# Patient Record
Sex: Male | Born: 1980 | Race: White | Hispanic: No | Marital: Married | Smoking: Former smoker
Health system: Southern US, Community
[De-identification: ages and names within clinical notes are randomized; demographics above are authoritative.]

## PROBLEM LIST (undated history)

## (undated) DIAGNOSIS — T7840XA Allergy, unspecified, initial encounter: Secondary | ICD-10-CM

## (undated) HISTORY — DX: Allergy, unspecified, initial encounter: T78.40XA

---

## 1998-08-26 ENCOUNTER — Encounter: Payer: Self-pay | Admitting: Emergency Medicine

## 1998-08-26 ENCOUNTER — Emergency Department (HOSPITAL_COMMUNITY): Admission: EM | Admit: 1998-08-26 | Discharge: 1998-08-26 | Payer: Self-pay | Admitting: Emergency Medicine

## 2013-02-13 ENCOUNTER — Ambulatory Visit: Payer: 59

## 2013-02-13 ENCOUNTER — Ambulatory Visit (INDEPENDENT_AMBULATORY_CARE_PROVIDER_SITE_OTHER): Payer: 59 | Admitting: Family Medicine

## 2013-02-13 VITALS — BP 110/68 | HR 60 | Temp 98.9°F | Resp 16 | Ht 69.0 in | Wt 160.0 lb

## 2013-02-13 DIAGNOSIS — M79609 Pain in unspecified limb: Secondary | ICD-10-CM

## 2013-02-13 DIAGNOSIS — M79645 Pain in left finger(s): Secondary | ICD-10-CM

## 2013-02-13 DIAGNOSIS — S62639A Displaced fracture of distal phalanx of unspecified finger, initial encounter for closed fracture: Secondary | ICD-10-CM

## 2013-02-13 NOTE — Progress Notes (Signed)
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   Able to move thumb well.  Right handed.  Objective:  Left thumb subungual hematoma released with some relief  [facility] reading (PRIMARY) by  Dr. Milus Glazier:  Left thumb tuft fracture  Assessment:  Thumb fx complicated by subungual hematoma, left  Plan:  Thumb splint ibuprofen Signed,  Elvina Sidle, MD.

## 2013-03-14 ENCOUNTER — Ambulatory Visit (INDEPENDENT_AMBULATORY_CARE_PROVIDER_SITE_OTHER): Payer: 59 | Admitting: Family Medicine

## 2013-03-14 VITALS — BP 122/78 | HR 70 | Temp 98.0°F | Resp 16 | Ht 67.0 in | Wt 156.0 lb

## 2013-03-14 DIAGNOSIS — M545 Low back pain, unspecified: Secondary | ICD-10-CM

## 2013-03-14 DIAGNOSIS — IMO0001 Reserved for inherently not codable concepts without codable children: Secondary | ICD-10-CM

## 2013-03-14 DIAGNOSIS — M5380 Other specified dorsopathies, site unspecified: Secondary | ICD-10-CM

## 2013-03-14 DIAGNOSIS — M7918 Myalgia, other site: Secondary | ICD-10-CM

## 2013-03-14 DIAGNOSIS — M62830 Muscle spasm of back: Secondary | ICD-10-CM

## 2013-03-14 LAB — COMPREHENSIVE METABOLIC PANEL
ALT: 12 U/L (ref 0–53)
AST: 15 U/L (ref 0–37)
Alkaline Phosphatase: 64 U/L (ref 39–117)
BUN: 14 mg/dL (ref 6–23)
Calcium: 9.7 mg/dL (ref 8.4–10.5)
Chloride: 104 mEq/L (ref 96–112)
Creat: 0.9 mg/dL (ref 0.50–1.35)
Total Bilirubin: 0.3 mg/dL (ref 0.3–1.2)

## 2013-03-14 LAB — POCT URINALYSIS DIPSTICK
Bilirubin, UA: NEGATIVE
Blood, UA: NEGATIVE
Glucose, UA: NEGATIVE
Leukocytes, UA: NEGATIVE
Nitrite, UA: NEGATIVE
Urobilinogen, UA: 0.2
pH, UA: 7

## 2013-03-14 LAB — POCT CBC
Granulocyte percent: 60.3 %G (ref 37–80)
HCT, POC: 45.5 % (ref 43.5–53.7)
Hemoglobin: 14.8 g/dL (ref 14.1–18.1)
MPV: 7.7 fL (ref 0–99.8)
POC Granulocyte: 4.2 (ref 2–6.9)
POC MID %: 6.7 %M (ref 0–12)
RDW, POC: 13.1 %

## 2013-03-14 LAB — C-REACTIVE PROTEIN: CRP: <0.5 mg/dL (ref <0.60)

## 2013-03-14 LAB — POCT SEDIMENTATION RATE: POCT SED RATE: 3 mm/hr (ref 0–22)

## 2013-03-14 MED ORDER — IBUPROFEN 800 MG PO TABS: 800.0000 mg | ORAL_TABLET | Freq: Three times a day (TID) | ORAL | Status: DC | PRN

## 2013-03-14 MED ORDER — CYCLOBENZAPRINE HCL 10 MG PO TABS: 10.0000 mg | ORAL_TABLET | Freq: Two times a day (BID) | ORAL | Status: DC | PRN
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
## 2013-03-14 NOTE — Patient Instructions (Addendum)
I recommend keeping ibuprofen on board - esp taking before and during work. Then when you come home, take a muscle relaxant followed by 15 minutes of heat followed by gentle stretching. Try to do this regiment 2 - 3 times a day.  If you are still having pain in 4 to 6 wks, come back to clinic for further eval. RTC immed if symptoms worsen or you develop any other concerning symptoms below.   Back Pain, Adult Low back pain is very common. About 1 in 5 people have back pain.The cause of low back pain is rarely dangerous. The pain often gets better over time.About half of people with a sudden onset of back pain feel better in just 2 weeks. About 8 in 10 people feel better by 6 weeks.  CAUSES Some common causes of back pain include:  Strain of the muscles or ligaments supporting the spine.  Wear and tear (degeneration) of the spinal discs.  Arthritis.  Direct injury to the back. DIAGNOSIS Most of the time, the direct cause of low back pain is not known.However, back pain can be treated effectively even when the exact cause of the pain is unknown.Answering your caregiver's questions about your overall health and symptoms is one of the most accurate ways to make sure the cause of your pain is not dangerous. If your caregiver needs more information, he or she may order lab work or imaging tests (X-rays or MRIs).However, even if imaging tests show changes in your back, this usually does not require surgery. HOME CARE INSTRUCTIONS For many people, back pain returns.Since low back pain is rarely dangerous, it is often a condition that people can learn to [facility] their own.   Remain active. It is stressful on the back to sit or stand in one place. Do not sit, drive, or stand in one place for more than 30 minutes at a time. Take short walks on level surfaces as soon as pain allows.Try to increase the length of time you walk each day.  Do not stay in bed.Resting more than 1 or 2 days can delay  your recovery.  Do not avoid exercise or work.Your body is made to move.It is not dangerous to be active, even though your back may hurt.Your back will likely heal faster if you return to being active before your pain is gone.  Pay attention to your body when you bend and lift. Many people have less discomfortwhen lifting if they bend their knees, keep the load close to their bodies,and avoid twisting. Often, the most comfortable positions are those that put less stress on your recovering back.  Find a comfortable position to sleep. Use a firm mattress and lie on your side with your knees slightly bent. If you lie on your back, put a pillow under your knees.  Only take over-the-counter or prescription medicines as directed by your caregiver. Over-the-counter medicines to reduce pain and inflammation are often the most helpful.Your caregiver may prescribe muscle relaxant drugs.These medicines help dull your pain so you can more quickly return to your normal activities and healthy exercise.  Put ice on the injured area.  Put ice in a plastic bag.  Place a towel between your skin and the bag.  Leave the ice on for 15-20 minutes, 3-4 times a day for the first 2 to 3 days. After that, ice and heat may be alternated to reduce pain and spasms.  Ask your caregiver about trying back exercises and gentle massage. This may be  of some benefit.  Avoid feeling anxious or stressed.Stress increases muscle tension and can worsen back pain.It is important to recognize when you are anxious or stressed and learn ways to manage it.Exercise is a great option. SEEK MEDICAL CARE IF:  You have pain that is not relieved with rest or medicine.  You have pain that does not improve in 1 week.  You have new symptoms.  You are generally not feeling well. SEEK IMMEDIATE MEDICAL CARE IF:   You have pain that radiates from your back into your legs.  You develop new bowel or bladder control  problems.  You have unusual weakness or numbness in your arms or legs.  You develop nausea or vomiting.  You develop abdominal pain.  You feel faint. Document Released: 06/06/2005 Document Revised: 12/06/2011 Document Reviewed: 10/25/2010 [facility] Patient Information 2014 [employer], [place].

## 2013-05-09 ENCOUNTER — Encounter: Payer: Self-pay | Admitting: Family Medicine

## 2013-06-14 DIAGNOSIS — M549 Dorsalgia, unspecified: Secondary | ICD-10-CM | POA: Insufficient documentation

## 2013-07-30 ENCOUNTER — Other Ambulatory Visit: Payer: Self-pay | Admitting: Orthopaedic Surgery

## 2013-07-30 DIAGNOSIS — M542 Cervicalgia: Secondary | ICD-10-CM

## 2013-07-30 DIAGNOSIS — M546 Pain in thoracic spine: Secondary | ICD-10-CM

## 2013-08-01 ENCOUNTER — Other Ambulatory Visit: Payer: Self-pay | Admitting: Orthopaedic Surgery

## 2013-08-01 DIAGNOSIS — M546 Pain in thoracic spine: Secondary | ICD-10-CM

## 2013-08-01 DIAGNOSIS — M542 Cervicalgia: Secondary | ICD-10-CM

## 2013-08-12 ENCOUNTER — Ambulatory Visit
Admission: RE | Admit: 2013-08-12 | Discharge: 2013-08-12 | Disposition: A | Discharge status: Home / Self Care | Payer: 59 | Source: Ambulatory Visit | Attending: Orthopaedic Surgery | Admitting: Orthopaedic Surgery

## 2013-08-12 DIAGNOSIS — M542 Cervicalgia: Secondary | ICD-10-CM

## 2013-08-12 DIAGNOSIS — M546 Pain in thoracic spine: Secondary | ICD-10-CM

## 2014-02-14 ENCOUNTER — Ambulatory Visit (INDEPENDENT_AMBULATORY_CARE_PROVIDER_SITE_OTHER): Payer: 59 | Admitting: Family Medicine

## 2014-02-14 ENCOUNTER — Ambulatory Visit (INDEPENDENT_AMBULATORY_CARE_PROVIDER_SITE_OTHER): Payer: 59

## 2014-02-14 ENCOUNTER — Encounter: Payer: Self-pay | Admitting: Family Medicine

## 2014-02-14 VITALS — BP 96/62 | HR 71 | Temp 97.9°F | Resp 16 | Ht 67.5 in | Wt 155.0 lb

## 2014-02-14 DIAGNOSIS — M5380 Other specified dorsopathies, site unspecified: Secondary | ICD-10-CM

## 2014-02-14 DIAGNOSIS — R05 Cough: Secondary | ICD-10-CM

## 2014-02-14 DIAGNOSIS — R059 Cough, unspecified: Secondary | ICD-10-CM

## 2014-02-14 DIAGNOSIS — R002 Palpitations: Secondary | ICD-10-CM

## 2014-02-14 DIAGNOSIS — M62830 Muscle spasm of back: Secondary | ICD-10-CM

## 2014-02-14 DIAGNOSIS — F17200 Nicotine dependence, unspecified, uncomplicated: Secondary | ICD-10-CM

## 2014-02-14 DIAGNOSIS — R5381 Other malaise: Secondary | ICD-10-CM

## 2014-02-14 DIAGNOSIS — R5383 Other fatigue: Secondary | ICD-10-CM

## 2014-02-14 LAB — POCT CBC
Granulocyte percent: 59 %G (ref 37–80)
HCT, POC: 46.1 % (ref 43.5–53.7)
Hemoglobin: 15.1 g/dL (ref 14.1–18.1)
Lymph, poc: 2.4 (ref 0.6–3.4)
MCH, POC: 31.2 pg (ref 27–31.2)
MCHC: 32.8 g/dL (ref 31.8–35.4)
MCV: 95 fL (ref 80–97)
MID (CBC): 0.3 (ref 0–0.9)
MPV: 7.2 fL (ref 0–99.8)
PLATELET COUNT, POC: 229 10*3/uL (ref 142–424)
POC GRANULOCYTE: 3.8 (ref 2–6.9)
POC LYMPH %: 36.4 % (ref 10–50)
POC MID %: 4.6 % (ref 0–12)
RBC: 4.85 M/uL (ref 4.69–6.13)
RDW, POC: 13.5 %
WBC: 6.5 10*3/uL (ref 4.6–10.2)

## 2014-02-14 LAB — VITAMIN B12: VITAMIN B 12: 450 pg/mL (ref 211–911)

## 2014-02-14 LAB — COMPREHENSIVE METABOLIC PANEL
ALK PHOS: 60 U/L (ref 39–117)
ALT: 11 U/L (ref 0–53)
AST: 15 U/L (ref 0–37)
Albumin: 4.9 g/dL (ref 3.5–5.2)
BILIRUBIN TOTAL: 0.7 mg/dL (ref 0.2–1.2)
BUN: 13 mg/dL (ref 6–23)
CO2: 26 mEq/L (ref 19–32)
Calcium: 9.7 mg/dL (ref 8.4–10.5)
Chloride: 105 mEq/L (ref 96–112)
Creat: 0.91 mg/dL (ref 0.50–1.35)
GLUCOSE: 84 mg/dL (ref 70–99)
Potassium: 4.1 mEq/L (ref 3.5–5.3)
Sodium: 141 mEq/L (ref 135–145)
TOTAL PROTEIN: 6.9 g/dL (ref 6.0–8.3)

## 2014-02-14 LAB — TSH: TSH: 0.816 u[IU]/mL (ref 0.350–4.500)

## 2014-02-14 LAB — POCT SEDIMENTATION RATE: POCT SED RATE: 2 mm/hr (ref 0–22)

## 2014-02-14 MED ORDER — IPRATROPIUM-ALBUTEROL 0.5-2.5 (3) MG/3ML IN SOLN
3.0000 mL | Freq: Once | RESPIRATORY_TRACT | Status: AC
  Administered 2014-02-14: 3 mL via RESPIRATORY_TRACT

## 2014-02-14 NOTE — Progress Notes (Addendum)
Subjective:  This chart was scribed for Ryan Sorenson, MD by Carl Best, Medical Scribe. This patient was seen in Room 25 and the patient's care was started at 4:44 PM.   Patient ID: Ryan Schaefer, male    DOB: August 15, 1980, 33 y.o.   MRN: [id number]  Chief Complaint  Patient presents with  . Cough    coughing up black phlem for two or three years  . Palpitations    feels like his heartrate increases at times  . Fatigue  . sweaty palms  . Back Pain    middle left side of back pain   HPI HPI Comments: Ryan Schaefer is a 33 y.o. male who presents to the [facility] complaining of constant left sided back pain.  He was seen by [facility] by Dr. Rosanne Gutting.; see problem list.  EKG was normal with no sinus changes.  X-ray revealed flattened diaphragms but otherwise no significant abnormalities.  He went to integrative therapy for physical therapy with no significant changes in his symptoms.  He has been doing back exercises to try and has seen mild improvement in his pain.  He has not had an MRI of his spine.  He did not experience any relief to his symptoms when he was using Flexeril.  He has had an injection in his back but not in the spot the pain is currently located.  He has never taken Prednisone for his symptoms.  He works on [employer] for a living.   He is also complaining of a constant cough productive of black sputum.  His wife states that he coughs everyday and has been more fatigued than usual.  He denies SOB as an associated symptom.  He states that he wheezes but contributes this to smoking 0.5 pack a day.  He is trying to quit smoking cold [place].  He has tried Chantix in the past but it did not relieve his nicotine cravings.  He has never used an inhaler in the past.  He does not take any vitamins, supplements, or OTC medications.     Patient Active Problem List   Diagnosis Date Noted  . Back pain 06/14/2013   No past medical history on  file. No past surgical history on file. Family History  Problem Relation Age of Onset  . Cancer Mother   . Diabetes Father   . Heart disease Maternal Grandmother   . Cancer Maternal Grandfather   . Diabetes Paternal Grandmother    History   Social History  . Marital Status: Married    Spouse Name: N/A    Number of Children: N/A  . Years of Education: N/A   Occupational History  . Not on file.   Social History Main Topics  . Smoking status: Current Every Day Smoker -- 1.50 packs/day    Types: Cigarettes  . Smokeless tobacco: Not on file  . Alcohol Use: Yes  . Drug Use: No  . Sexual Activity: Not on file   Other Topics Concern  . Not on file   Social History Narrative  . No narrative on file   No Known Allergies  Review of Systems  Constitutional: Positive for fatigue.  Respiratory: Positive for cough and wheezing. Negative for shortness of breath.      Objective:  BP 96/62  Pulse 71  Temp(Src) 97.9 F (36.6 C) (Oral)  Resp 16  Ht 5' 7.5" (1.715 m)  Wt 155 lb (70.308 kg)  BMI 23.90 kg/m2  SpO2 97% Physical Exam  Nursing note and vitals reviewed. Constitutional: He is oriented to person, place, and time. He appears well-developed and well-nourished.  HENT:  Head: Normocephalic and atraumatic.  Right Ear: Hearing, tympanic membrane, external ear and ear canal normal.  Left Ear: Hearing, external ear and ear canal normal. Tympanic membrane is injected. A middle ear effusion is present.  Mouth/Throat: Uvula is midline, oropharynx is clear and moist and mucous membranes are normal. No oropharyngeal exudate, posterior oropharyngeal edema or posterior oropharyngeal erythema.  Terminates mildly swollen.  Eyes: EOM are normal.  Neck: Normal range of motion.  Cardiovascular: Normal rate, regular rhythm and normal heart sounds.  Exam reveals no gallop and no friction rub.   No murmur heard. Pulmonary/Chest: Effort normal. No respiratory distress. He has wheezes.  He has no rales.  Musculoskeletal: Normal range of motion.  Lymphadenopathy:    He has no cervical adenopathy.  Neurological: He is alert and oriented to person, place, and time.  Skin: Skin is warm and dry.  Psychiatric: He has a normal mood and affect. His behavior is normal.   S/p duoneb, pulmonary exam unchanged  Results for orders placed in visit on 02/14/14  POCT CBC      Result Value Ref Range   WBC 6.5  4.6 - 10.2 K/uL   Lymph, poc 2.4  0.6 - 3.4   POC LYMPH PERCENT 36.4  10 - 50 %L   MID (cbc) 0.3  0 - 0.9   POC MID % 4.6  0 - 12 %M   POC Granulocyte 3.8  2 - 6.9   Granulocyte percent 59.0  37 - 80 %G   RBC 4.85  4.69 - 6.13 M/uL   Hemoglobin 15.1  14.1 - 18.1 g/dL   HCT, POC 09.8  11.9 - 53.7 %   MCV 95.0  80 - 97 fL   MCH, POC 31.2  27 - 31.2 pg   MCHC 32.8  31.8 - 35.4 g/dL   RDW, POC 14.7     Platelet Count, POC 229  142 - 424 K/uL   MPV 7.2  0 - 99.8 fL   Peak flow - 640; predicted peak flow 630  [facility] reading (PRIMARY) by  Dr. Clelia Croft. CXR: No acute abnormality  EXAM: CHEST 2 VIEW  COMPARISON: 11/20/2007  FINDINGS: The heart size and mediastinal contours are within normal limits. Both lungs are clear. The visualized skeletal structures are unremarkable.  IMPRESSION: No active cardiopulmonary disease.  EKG: NSR no ischemic changes  Assessment & Plan:  .  Discussed administering a breathing treatment in the office.  Goal peak flow is 630. Cough - Plan: POCT CBC, POCT SEDIMENTATION RATE, DG Chest 2 View, EKG 12-Lead, ipratropium-albuterol (DUONEB) 0.5-2.5 (3) MG/3ML nebulizer solution 3 mL  Palpitations - Plan: POCT CBC, EKG 12-Lead - reassured, if sxs cont, refer to cardiology for event monitor.  Other malaise and fatigue - Plan: Comprehensive metabolic panel, TSH, Vitamin B12, Vit D  25 hydroxy (rtn osteoporosis monitoring)  Tobacco use disorder - pt planning to quit in 3d w/ a friend - cold [place]  Lumbar paraspinal muscle spasm - Discussed a  clinical suspicion of a muscle spasm in the patient's back due to a previous back injury.  Discussed starting the patient on Prednisone, referring him to different physical therapists, and administering a trigger point Prednisone injection of the paraspinal muscle. Failed flexeril, has seen Dr. Yevette Edwards and been [facility] - feels like muscle spasm  has moved a little but this one has been present about a yr.  Declines intervention at this time but if he changes his mind, ok to call in prednisone burst - 80 mg qd x 3d, or refer to other PT - consider referral to Dr. Raeanne Barry. . . .  Meds ordered this encounter  Medications  . ipratropium-albuterol (DUONEB) 0.5-2.5 (3) MG/3ML nebulizer solution 3 mL    Sig:     I personally performed the services described in this documentation, which was scribed in my presence. The recorded information has been reviewed and considered, and addended by me as needed.  Ryan Sorenson, MD MPH

## 2014-02-15 LAB — VITAMIN D 25 HYDROXY (VIT D DEFICIENCY, FRACTURES): Vit D, 25-Hydroxy: 40 ng/mL (ref 30–89)

## 2014-02-16 ENCOUNTER — Encounter: Payer: Self-pay | Admitting: Family Medicine

## 2014-09-15 ENCOUNTER — Other Ambulatory Visit (HOSPITAL_COMMUNITY): Payer: Self-pay

## 2016-04-08 IMAGING — CR DG CHEST 2V
2 series · 2 of 2 positions shown · non-contrast
Comparison: 11/20/2007

CLINICAL DATA: Cough

EXAM:
CHEST  2 VIEW

[lateral]
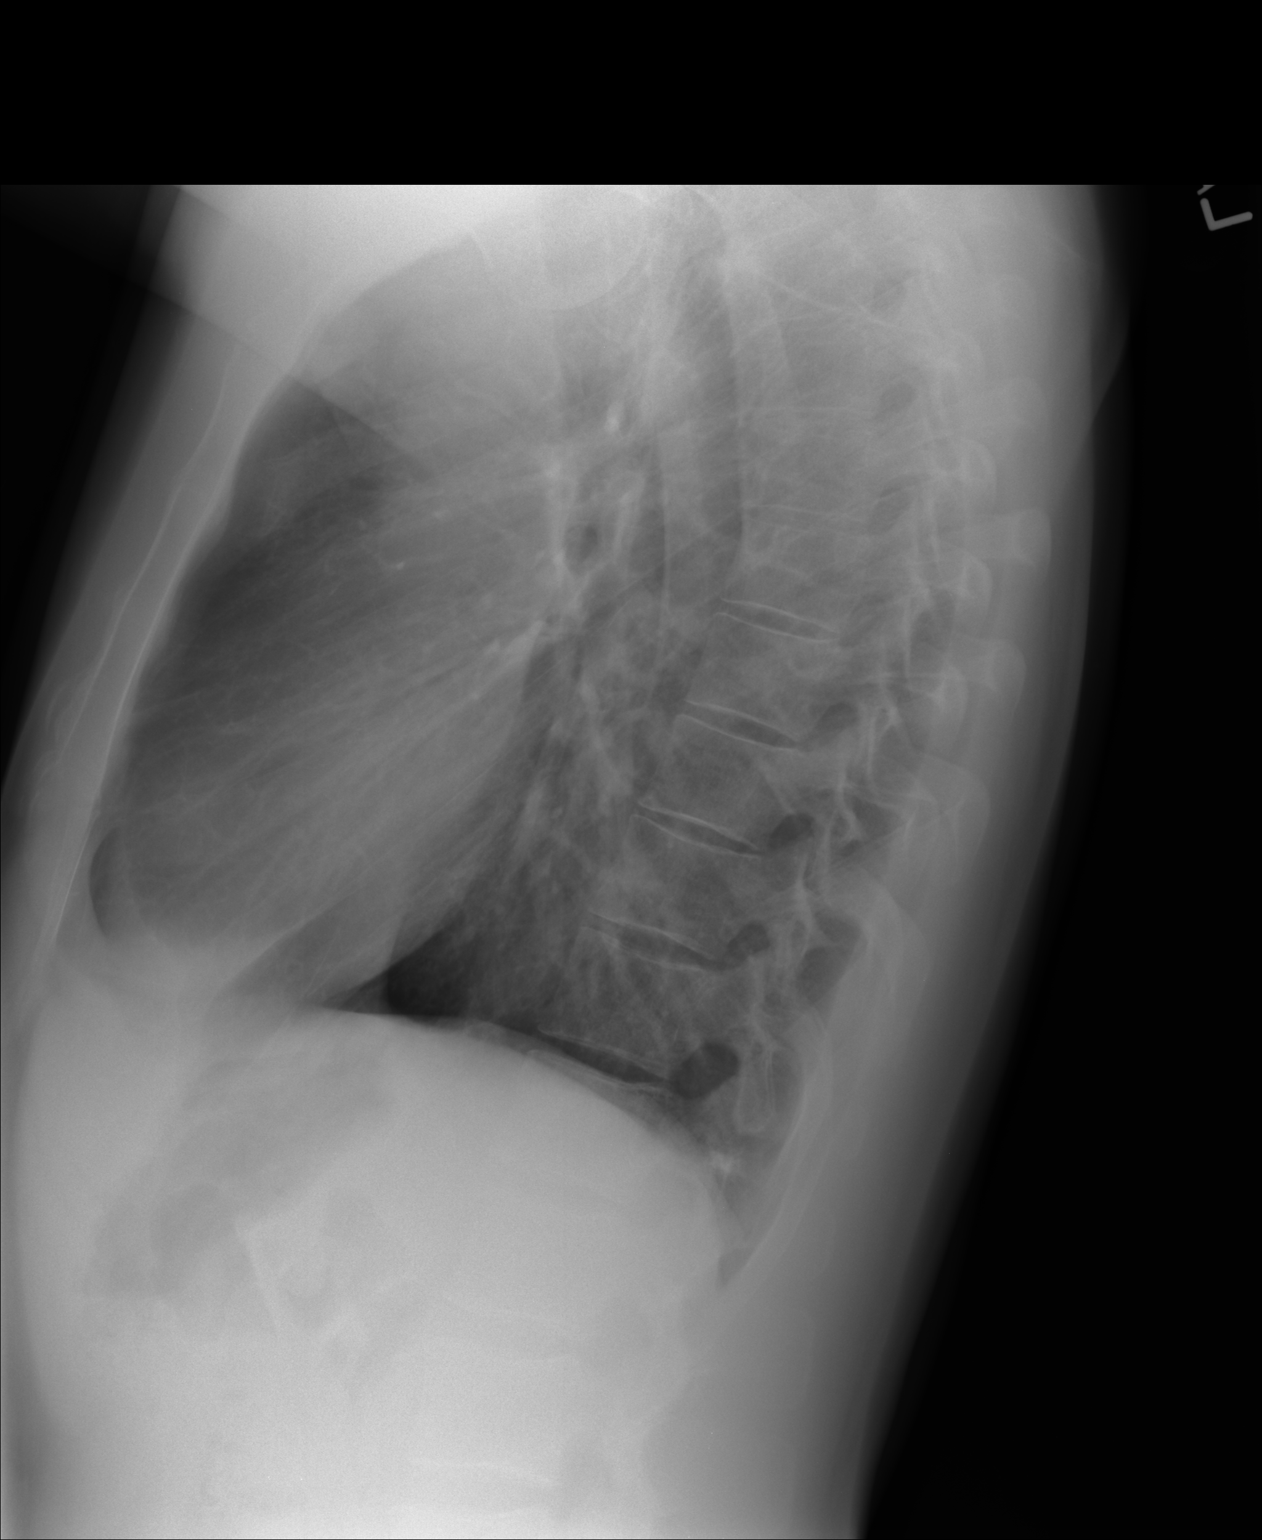

[PA]
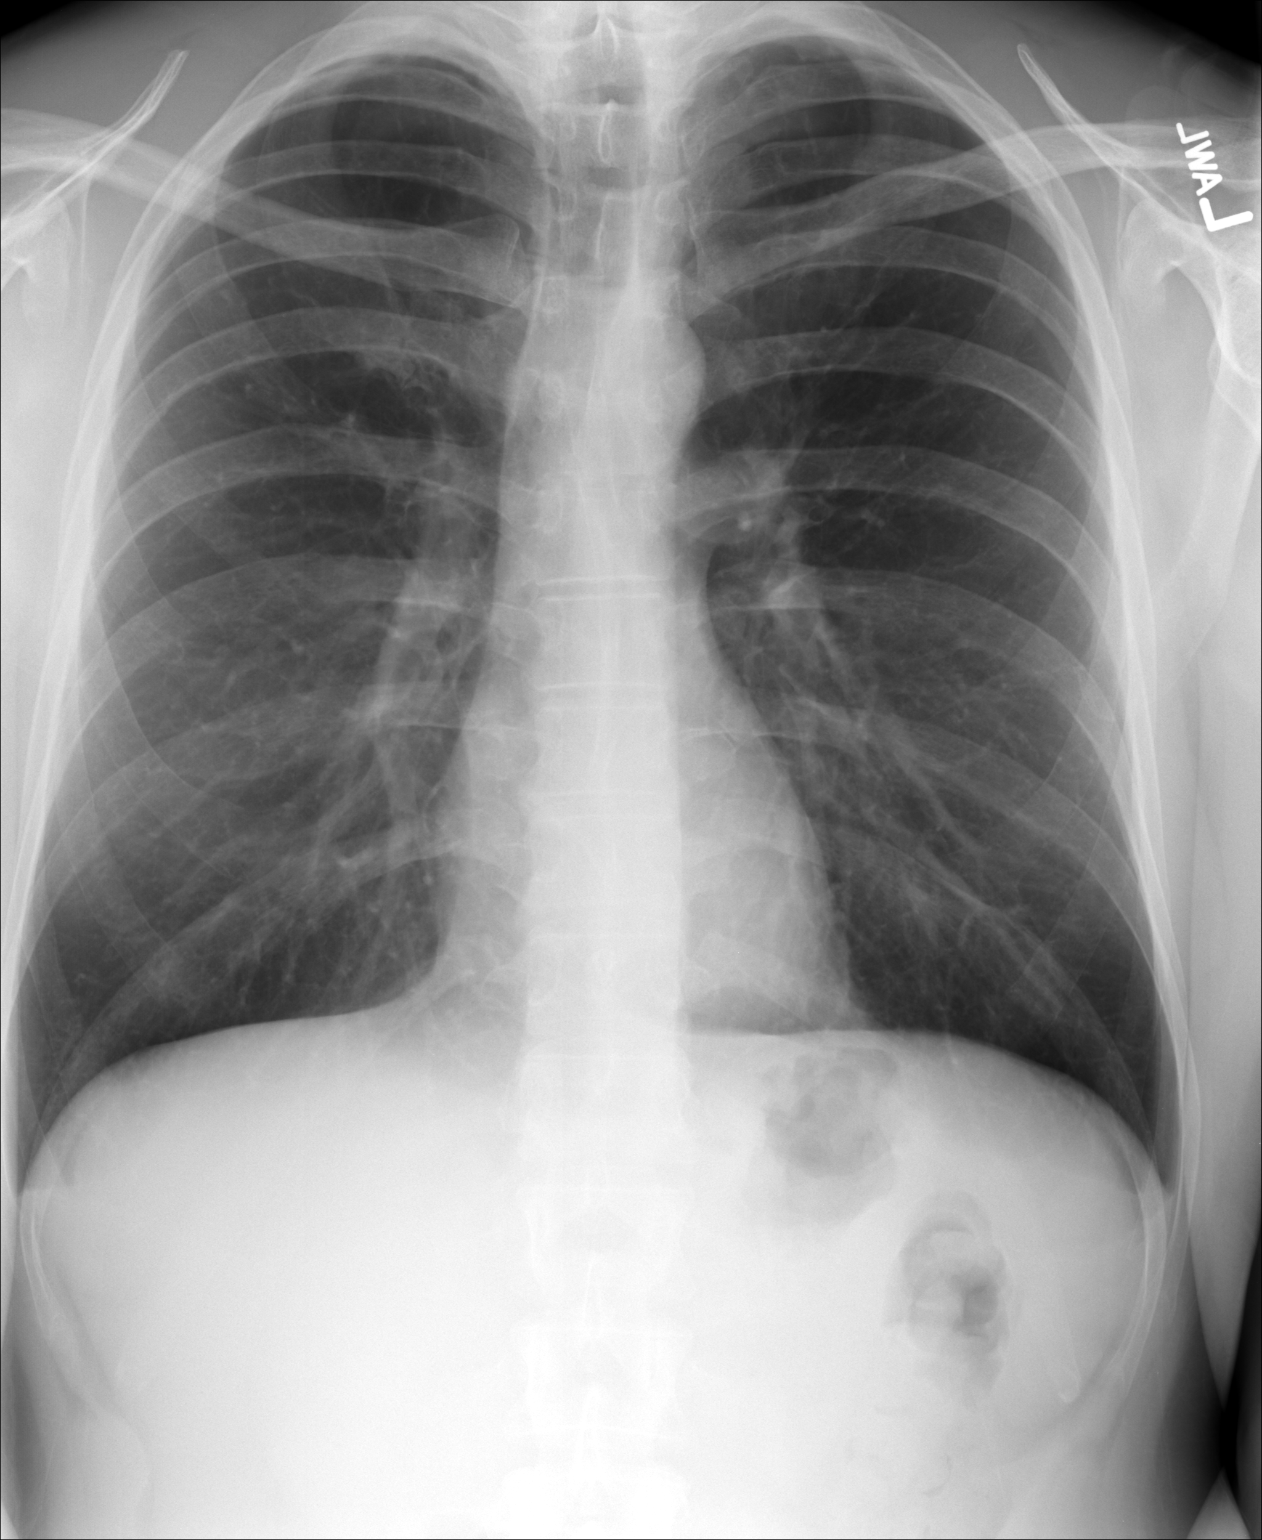

[2 of 2 positions shown; findings below may reference images not displayed]

FINDINGS: The heart size and mediastinal contours are within normal limits.
Both lungs are clear. The visualized skeletal structures are
unremarkable.
IMPRESSION: No active cardiopulmonary disease.

## 2016-12-10 ENCOUNTER — Ambulatory Visit (INDEPENDENT_AMBULATORY_CARE_PROVIDER_SITE_OTHER): Payer: 59

## 2016-12-10 ENCOUNTER — Ambulatory Visit (INDEPENDENT_AMBULATORY_CARE_PROVIDER_SITE_OTHER): Payer: 59 | Admitting: Family Medicine

## 2016-12-10 ENCOUNTER — Encounter: Payer: Self-pay | Admitting: Family Medicine

## 2016-12-10 VITALS — BP 104/68 | HR 54 | Temp 98.8°F | Resp 16 | Ht 68.5 in | Wt 148.0 lb

## 2016-12-10 DIAGNOSIS — R634 Abnormal weight loss: Secondary | ICD-10-CM

## 2016-12-10 DIAGNOSIS — R3129 Other microscopic hematuria: Secondary | ICD-10-CM | POA: Diagnosis not present

## 2016-12-10 LAB — POCT CBC
GRANULOCYTE PERCENT: 77.4 % (ref 37–80)
HCT, POC: 44.3 % (ref 43.5–53.7)
HEMOGLOBIN: 15.4 g/dL (ref 14.1–18.1)
Lymph, poc: 1.8 (ref 0.6–3.4)
MCH: 32 pg — AB (ref 27–31.2)
MCHC: 34.7 g/dL (ref 31.8–35.4)
MCV: 92.2 fL (ref 80–97)
MID (cbc): 0.2 (ref 0–0.9)
MPV: 6.5 fL (ref 0–99.8)
PLATELET COUNT, POC: 230 10*3/uL (ref 142–424)
POC Granulocyte: 7 — AB (ref 2–6.9)
POC LYMPH PERCENT: 20.1 %L (ref 10–50)
POC MID %: 2.5 % (ref 0–12)
RBC: 4.8 M/uL (ref 4.69–6.13)
RDW, POC: 13.2 %
WBC: 9.1 10*3/uL (ref 4.6–10.2)

## 2016-12-10 LAB — POCT URINALYSIS DIP (MANUAL ENTRY)
BILIRUBIN UA: NEGATIVE
GLUCOSE UA: NEGATIVE mg/dL
Ketones, POC UA: NEGATIVE mg/dL
Leukocytes, UA: NEGATIVE
Nitrite, UA: NEGATIVE
Protein Ur, POC: NEGATIVE mg/dL
SPEC GRAV UA: 1.02 (ref 1.010–1.025)
Urobilinogen, UA: 0.2 E.U./dL
pH, UA: 5.5 (ref 5.0–8.0)

## 2016-12-10 LAB — POCT GLYCOSYLATED HEMOGLOBIN (HGB A1C): Hemoglobin A1C: 5.1

## 2016-12-10 LAB — POC MICROSCOPIC URINALYSIS (UMFC)

## 2016-12-10 MED ORDER — OMEPRAZOLE 40 MG PO CPDR: 40.0000 mg | DELAYED_RELEASE_CAPSULE | Freq: Every day | ORAL | 1 refills | Status: DC

## 2016-12-10 NOTE — Progress Notes (Signed)
Patient ID: Fernanda DrumDavid L Styles, male   DOB: 12/26/1980, 36 y.o.   MRN: [id number] Spirometry, EKG, PSA, chest CT if neg Send
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
## 2016-12-10 NOTE — Patient Instructions (Addendum)
Thank you for coming in today, it was so nice to see you! Today we talked about:    All of your blood work so far looks normal. Your chest xray looks normal as well. We will have you come back in 1 month to check your weight and follow up with how you are doing.   Please follow up in 1 month. You can schedule this appointment at the front desk before you leave or call the clinic.  Sincerely,  Anders Simmonds, MD  Dg Chest 2 View  Result Date: 12/10/2016 CLINICAL DATA:  Weight loss. EXAM: CHEST  2 VIEW COMPARISON:  February 14, 2014 FINDINGS: The heart size and mediastinal contours are within normal limits. Both lungs are clear. The visualized skeletal structures are unremarkable. IMPRESSION: No active cardiopulmonary disease. Electronically Signed   By: Gerome Sam III M.D   On: 12/10/2016 12:10     IF you received an x-ray today, you will receive an invoice from [facility]. Please contact [facility] at [phone] with questions or concerns regarding your invoice.   IF you received labwork today, you will receive an invoice from [facility]. Please contact [facility] at [phone] with questions or concerns regarding your invoice.   Our billing staff will not be able to assist you with questions regarding bills from these companies.  You will be contacted with the lab results as soon as they are available. The fastest way to get your results is to activate your My Chart account. Instructions are located on the last page of this paperwork. If you have not heard from [place] regarding the results in 2 weeks, please contact this office.     Failure to Thrive, Adult Failure to thrive is a group of symptoms that affect elderly adults. These symptoms include loss of appetite and weight loss. People who have this condition may do fewer and fewer activities over time. They may lose interest in being with friends or they may not want to eat or drink. This condition is not a  normal part of aging. What are the causes? This condition may be caused by:  A disease, such dementia, diabetes, cancer, or lung disease.  A health problem, such as a vitamin deficiency or a heart problem.  A disorder, such as depression.  A disability.  Medicines.  Mistreatment or neglect.  In some cases, the cause may not be known. What are the signs or symptoms? Symptoms of this condition include:  Loss of more than 5% of your body weight.  Being more tired than normal after an activity.  Having trouble getting up after sitting.  Loss of appetite.  Not getting out of bed.  Not wanting to do usual activities.  Depression.  Getting infections often.  Bedsores.  Taking a long time to recover after an injury or a surgery.  Weakness.  How is this diagnosed? This condition may be diagnosed with a physical exam. Your health care provider will ask questions about your health, behavior, and mood, such as:  Has your activity changed?  Do you seem sad?  Are your eating habits different?  Tests may also be done. They may include:  Blood tests.  Urine tests.  Imaging tests, such as X-rays, a CT scan, or MRI.  Hearing tests.  Vision tests.  Tests to check thinking ability (cognitive tests).  Activity tests to see if you can do tasks such as bathing and dressing and to see if you can move around safely.  You may  be referred to a specialist. How is this treated? Treatment for this condition depends on the cause. It may involve:  Treating the cause.  Talk therapy or medicine to treat depression.  Improving diet, such as by eating more often or taking nutritional supplements.  Changing or stopping a medicine.  Physical therapy.  It often takes a team of health care providers to find the right treatment. Follow these instructions at home:  Take over-the-counter and prescription medicines only as told by your health care provider.  Eat a healthy,  well-balanced diet. Make sure to get enough calories in each meal.  Be physically active. Include strength training as part of your exercise routine. A physical therapist can help to set up an exercise program that fits you.  Make sure that you are safe at home.  Make sure that you have a plan for what to do if you become unable to make decisions for yourself. Contact a health care provider if:  You are not able to eat well.  You are not able to move around.  You feel very sad or hopeless. Get help right away if:  You have thoughts of ending your life.  You cannot eat or drink.  You do not get out of bed.  Staying at home is no longer safe.  You have a fever. This information is not intended to replace advice given to you by your health care provider. Make sure you discuss any questions you have with your health care provider. Document Released: 08/29/2011 Document Revised: 11/12/2015 Document Reviewed: 09/01/2014 Elsevier Interactive Patient Education  [employer]2018 [employer]

## 2016-12-11 LAB — COMPREHENSIVE METABOLIC PANEL
ALBUMIN: 5.1 g/dL (ref 3.5–5.5)
ALT: 21 IU/L (ref 0–44)
AST: 27 IU/L (ref 0–40)
Albumin/Globulin Ratio: 2.3 — ABNORMAL HIGH (ref 1.2–2.2)
Alkaline Phosphatase: 70 IU/L (ref 39–117)
BUN / CREAT RATIO: 13 (ref 9–20)
BUN: 13 mg/dL (ref 6–20)
Bilirubin Total: 0.7 mg/dL (ref 0.0–1.2)
CALCIUM: 9.7 mg/dL (ref 8.7–10.2)
CO2: 24 mmol/L (ref 20–29)
CREATININE: 0.97 mg/dL (ref 0.76–1.27)
Chloride: 101 mmol/L (ref 96–106)
GFR, EST AFRICAN AMERICAN: 114 mL/min/{1.73_m2} (ref >59)
GFR, EST NON AFRICAN AMERICAN: 99 mL/min/{1.73_m2} (ref >59)
GLOBULIN, TOTAL: 2.2 g/dL (ref 1.5–4.5)
Glucose: 88 mg/dL (ref 65–99)
Potassium: 4.3 mmol/L (ref 3.5–5.2)
SODIUM: 140 mmol/L (ref 134–144)
TOTAL PROTEIN: 7.3 g/dL (ref 6.0–8.5)

## 2016-12-11 LAB — THYROID PANEL WITH TSH
FREE THYROXINE INDEX: 1.5 (ref 1.2–4.9)
T3 UPTAKE RATIO: 24 % (ref 24–39)
T4 TOTAL: 6.2 ug/dL (ref 4.5–12.0)
TSH: 1.61 u[IU]/mL (ref 0.450–4.500)

## 2016-12-11 LAB — LIPID PANEL
CHOL/HDL RATIO: 3.3 ratio (ref 0.0–5.0)
Cholesterol, Total: 164 mg/dL (ref 100–199)
HDL: 49 mg/dL (ref >39)
LDL Calculated: 92 mg/dL (ref 0–99)
TRIGLYCERIDES: 114 mg/dL (ref 0–149)
VLDL Cholesterol Cal: 23 mg/dL (ref 5–40)

## 2016-12-11 LAB — SEDIMENTATION RATE: Sed Rate: 2 mm/hr (ref 0–15)

## 2016-12-11 LAB — HIV ANTIBODY (ROUTINE TESTING W REFLEX): HIV Screen 4th Generation wRfx: NONREACTIVE

## 2016-12-11 LAB — C-REACTIVE PROTEIN: CRP: 1.3 mg/L (ref 0.0–4.9)

## 2019-02-02 IMAGING — DX DG CHEST 2V
2 series · 2 of 2 positions shown · non-contrast
Comparison: February 14, 2014

CLINICAL DATA: Weight loss.

EXAM:
CHEST  2 VIEW

[chest pa]
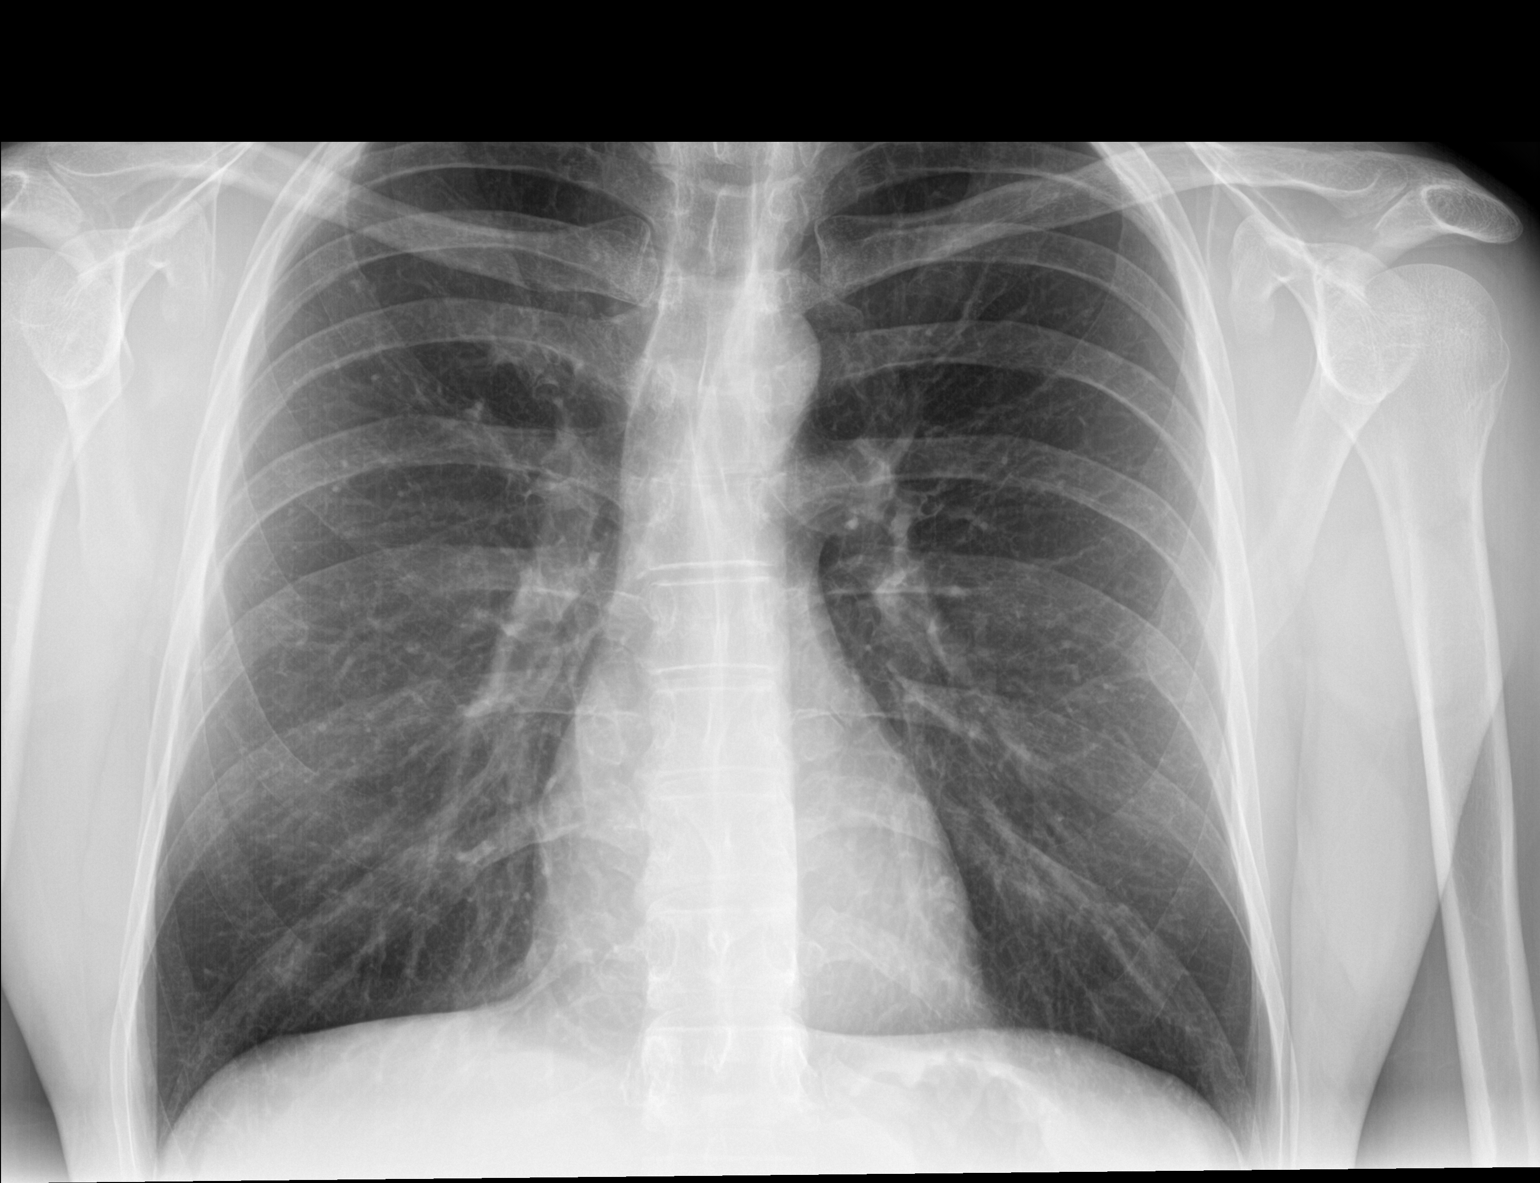

[chest lat]
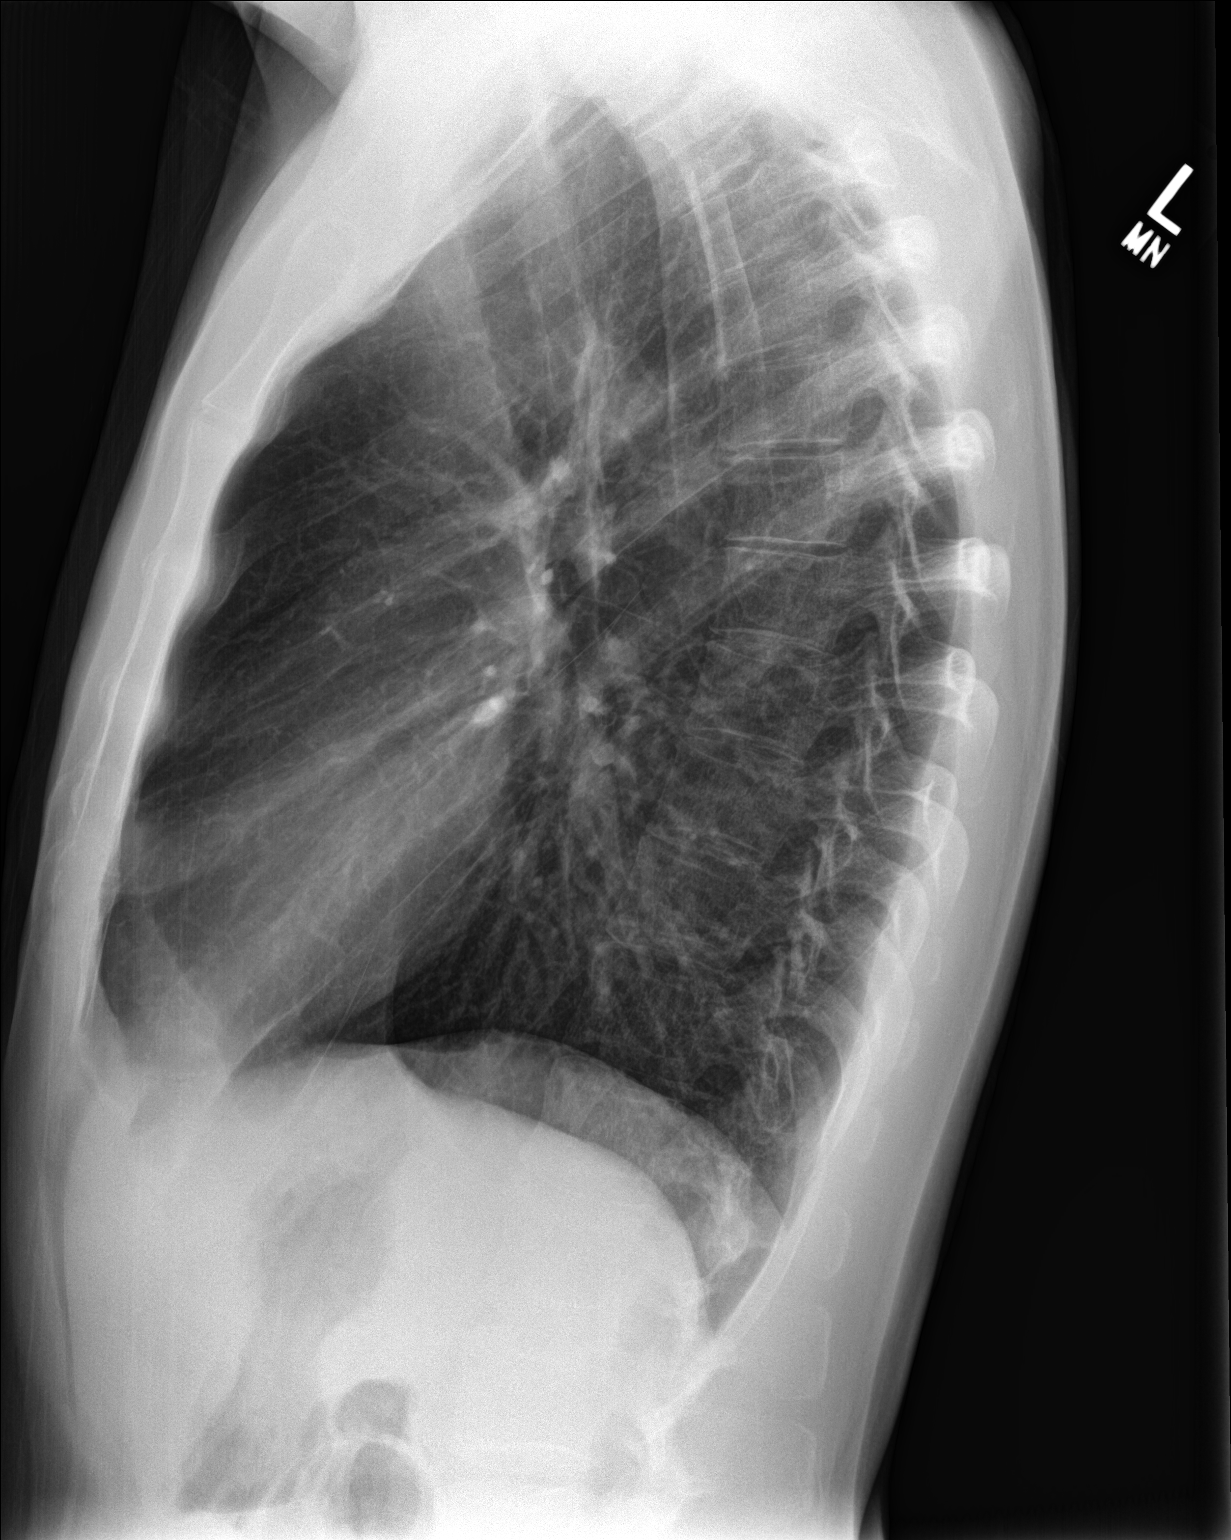

[2 of 2 positions shown; findings below may reference images not displayed]

FINDINGS: The heart size and mediastinal contours are within normal limits.
Both lungs are clear. The visualized skeletal structures are
unremarkable.
IMPRESSION: No active cardiopulmonary disease.

## 2019-06-21 HISTORY — PX: VASECTOMY: SHX75

## 2021-09-22 ENCOUNTER — Encounter: Payer: Self-pay | Admitting: Physician Assistant

## 2021-09-22 ENCOUNTER — Ambulatory Visit: Payer: Self-pay | Admitting: Physician Assistant

## 2021-09-22 ENCOUNTER — Ambulatory Visit: Payer: 59 | Admitting: Physician Assistant

## 2021-09-22 VITALS — BP 114/68 | HR 66 | Ht 69.0 in | Wt 175.2 lb

## 2021-09-22 DIAGNOSIS — K625 Hemorrhage of anus and rectum: Secondary | ICD-10-CM

## 2021-09-22 DIAGNOSIS — K529 Noninfective gastroenteritis and colitis, unspecified: Secondary | ICD-10-CM

## 2021-09-22 MED ORDER — PLENVU 140 G PO SOLR: 1.0000 | ORAL | 0 refills | Status: DC

## 2021-09-22 NOTE — Progress Notes (Signed)
? ?Subjective:  ? ? Patient ID: Ryan Schaefer, male    DOB: 1980-10-25, 41 y.o.   MRN: [id number] ? ?HPI ?Ryan Schaefer is a pleasant 41 year old white male, new to GI today referred by a pleasant [place] family Jodi Geralds, NP for further evaluation after recent episode of rectal bleeding. ?Patient is in generally good health, on no regular medications.  He has not had any prior GI evaluation. ?He says that his current symptoms occurred about 1 month ago when he developed some abdominal cramping, followed by diarrhea with multiple bowel movements, then started noticing bloody appearing bowel movements.  The first night of symptoms he says he had diarrhea about every 20 to 30 minutes and did not start seeing bloody bowel movements until the following day.  This went on for a couple of days and had completely resolved within about 5 days.  He had 1 episode of nausea and vomiting, does remember having some diaphoresis and fairly significant cramping for a couple of days.  No fever or chills.  Again not on any regular medications, no aspirin or NSAIDs, no regular EtOH.  No other family members ill. ?When seen by primary care he was told that he may have had norovirus.  Labs were done there on 08/04/2021 showing WBC of 12.9 hemoglobin 15.2 hematocrit of 42.1 be met unremarkable other than albumin of 5. ?Patient says that once his symptoms resolved he has not had any recurrence.  He feels perfectly fine at this point, no ongoing abdominal pain or discomfort, bowel movements very normal, no melena or hematochezia. ?Family history is negative for colon cancer polyps and IBD as far as he is aware. ? ? ? ?Review of Systems.Pertinent positive and negative review of systems were noted in the above HPI section.  All other review of systems was otherwise negative.  ? ?Outpatient Encounter Medications as of 09/22/2021  ?Medication Sig  ? PEG-KCl-NaCl-NaSulf-Na Asc-C (PLENVU) 140 g SOLR Take 1 kit by mouth as directed.  ?  [DISCONTINUED] omeprazole (PRILOSEC) 40 MG capsule Take 1 capsule (40 mg total) by mouth daily. 30 minutes before a meal  ? ?No facility-administered encounter medications on file as of 09/22/2021.  ? ?No Known Allergies ?Patient Active Problem List  ? Diagnosis Date Noted  ? Back pain 06/14/2013  ? ?Social History  ? ?Socioeconomic History  ? Marital status: Married  ?  Spouse name: Not on file  ? Number of children: 0  ? Years of education: Not on file  ? Highest education level: Not on file  ?Occupational History  ? Not on file  ?Tobacco Use  ? Smoking status: Former  ?  Packs/day: 1.50  ?  Types: Cigarettes  ? Smokeless tobacco: Never  ?Vaping Use  ? Vaping Use: Every day  ?Substance and Sexual Activity  ? Alcohol use: Not Currently  ? Drug use: No  ? Sexual activity: Not on file  ?Other Topics Concern  ? Not on file  ?Social History Narrative  ? Not on file  ? ?Social Determinants of Health  ? ?Financial Resource Strain: Not on file  ?Food Insecurity: Not on file  ?Transportation Needs: Not on file  ?Physical Activity: Not on file  ?Stress: Not on file  ?Social Connections: Not on file  ?Intimate Partner Violence: Not on file  ? ? ?Mr. Trembath's family history includes Bone cancer in his maternal grandfather; Diabetes in his father and paternal grandmother; Heart disease in his maternal grandmother; Lung cancer in his mother. ? ? ?   ?  Objective:  ?  ?Vitals:  ? 09/22/21 0827  ?BP: 114/68  ?Pulse: 66  ?SpO2: 97%  ? ? ?Physical Exam Well-developed well-nourished white male in no acute distress.  Weight, 175 BMI 25.8 ? ?HEENT; nontraumatic normocephalic, EOMI, PE R LA, sclera anicteric. ?Oropharynx; not examined today ?Neck; supple, no JVD ?Cardiovascular; regular rate and rhythm with S1-S2, no murmur rub or gallop ?Pulmonary; Clear bilaterally ?Abdomen; soft, nontender, nondistended, no palpable mass or hepatosplenomegaly, bowel sounds are active ?Rectal; not done today ?Skin; benign exam, no jaundice rash or  appreciable lesions ?Extremities; no clubbing cyanosis or edema skin warm and dry ?Neuro/Psych; alert and oriented x4, grossly nonfocal mood and affect appropriate  ? ? ? ?   ?Assessment & Plan:  ? ?#22 41 year old white male referred after an episode of abdominal cramping, diarrhea and bloody stools which occurred about 6 weeks ago.  Symptoms lasted for about 5 days, noticing bloody bowel movements, then resolved ?No recurrent symptoms since. ? ?I suspect he did have an acute infectious colitis, rule out foodborne, possible segmental ischemic colitis though less likely, rule out IBD, rule out occult neoplasm as source for the bleeding ? ?Plan; patient will be scheduled for colonoscopy with Dr. Tarri Glenn.  Procedure was discussed in detail with the patient including indications risk and benefits and he is agreeable to proceed. ?He is advised to call should he have any recurrence of symptoms in the interim and asked to speak to a nurse. ? ? ?Coda Mathey S Lamaya Hyneman PA-C ?09/22/2021 ? ? ?Cc: Ferd Hibbs, NP ?  ?

## 2021-09-22 NOTE — Patient Instructions (Signed)
If you are age 43 or younger, your body mass index should be between 19-25. Your Body mass index is 25.87 kg/m?Marland Kitchen If this is out of the aformentioned range listed, please consider follow up with your Primary Care Provider.  ?________________________________________________________ ? ?The [facility] providers would like to encourage you to use [facility] to communicate with providers for non-urgent requests or questions.  Due to long hold times on the telephone, sending your provider a message by [facility] may be a faster and more efficient way to get a response.  Please allow 48 business hours for a response.  Please remember that this is for non-urgent requests.  ?_______________________________________________________ ? ?You have been scheduled for a colonoscopy. Please follow written instructions given to you at your visit today.  ?Please pick up your prep supplies at the pharmacy within the next 1-3 days. ?If you use inhalers (even only as needed), please bring them with you on the day of your procedure. ? ?Follow up pending the results of your Colonoscopy or as needed. ? ?Thank you for entrusting me with your care and choosing [facility]. ? ?Mike Gip, PA-C ?

## 2021-09-24 NOTE — Progress Notes (Signed)
Reviewed and agree with management plans. ? ?Madeleine Fenn L. Taite Baldassari, MD, MPH  ?

## 2021-10-21 ENCOUNTER — Encounter: Payer: Self-pay | Admitting: Gastroenterology

## 2021-10-24 ENCOUNTER — Encounter: Payer: Self-pay | Admitting: Certified Registered Nurse Anesthetist

## 2021-10-26 ENCOUNTER — Telehealth: Payer: Self-pay | Admitting: Gastroenterology

## 2021-10-26 NOTE — Telephone Encounter (Signed)
Noted  

## 2021-10-26 NOTE — Telephone Encounter (Signed)
Good Morning Dr. Tarri Glenn, ? ?Patients wife called stating patient needed to reschedule his procedure on 5/11 3:30 to another date with no reason given. ? ?Patient was rescheduled for 6/26 at 9:00.  ?

## 2021-10-28 ENCOUNTER — Encounter: Payer: 59 | Admitting: Gastroenterology

## 2021-12-13 ENCOUNTER — Encounter: Payer: 59 | Admitting: Gastroenterology

## 2022-01-26 ENCOUNTER — Telehealth: Payer: Self-pay | Admitting: Gastroenterology

## 2022-01-26 NOTE — Telephone Encounter (Signed)
Inbound call from patient spouse cancelling upcoming procedure for 01/28/22, Due to patient having to report to work. States patient will callback to reschedule.

## 2022-01-26 NOTE — Telephone Encounter (Signed)
Noted  

## 2022-01-28 ENCOUNTER — Encounter: Payer: 59 | Admitting: Gastroenterology

## 2023-09-19 ENCOUNTER — Telehealth: Payer: Self-pay | Admitting: *Deleted

## 2023-09-19 ENCOUNTER — Ambulatory Visit: Attending: Cardiology | Admitting: Cardiology

## 2023-09-19 ENCOUNTER — Encounter: Payer: Self-pay | Admitting: Cardiology

## 2023-09-19 VITALS — BP 116/80 | HR 56 | Ht 69.0 in | Wt 157.4 lb

## 2023-09-19 DIAGNOSIS — R0683 Snoring: Secondary | ICD-10-CM | POA: Diagnosis not present

## 2023-09-19 DIAGNOSIS — I480 Paroxysmal atrial fibrillation: Secondary | ICD-10-CM | POA: Diagnosis not present

## 2023-09-19 NOTE — Telephone Encounter (Signed)
 Patient agreement reviewed and signed on 09/19/2023.  WatchPAT issued to patient on 09/19/2023 by Danielle Rankin, CMA. Patient aware to not open the WatchPAT box until contacted with the activation PIN. Patient profile initialized in Cloud[employer] on 09/19/2023 by Danielle Rankin, CMA. Device serial number: [id number]  Please list Reason for Call as Advice Only and type "WatchPAT issued to patient" in the comment box.

## 2023-09-19 NOTE — Patient Instructions (Addendum)
 Medication Instructions:  Your physician recommends that you continue on your current medications as directed. Please refer to the Current Medication list given to you today.  *If you need a refill on your cardiac medications before your next appointment, please call your pharmacy*   Lab Work: None ordered If you have labs (blood work) drawn today and your tests are completely normal, you will receive your results only by: MyChart Message (if you have MyChart) OR A paper copy in the mail If you have any lab test that is abnormal or we need to change your treatment, we will call you to review the results.   Testing/Procedures: Your physician has recommended that you have a  home sleep study. This test records several body functions during sleep, including: brain activity, eye movement, oxygen and carbon dioxide blood levels, heart rate and rhythm, breathing rate and rhythm, the flow of air through your mouth and nose, snoring, body muscle movements, and chest and belly movement.   Your physician has requested that you have an echocardiogram. Echocardiography is a painless test that uses sound waves to create images of your heart. It provides your doctor with information about the size and shape of your heart and how well your heart's chambers and valves are working. This procedure takes approximately one hour. There are no restrictions for this procedure. Please do NOT wear cologne, perfume, aftershave, or lotions (deodorant is allowed). Please arrive 15 minutes prior to your appointment time.  Please note: We ask at that you not bring children with you during ultrasound (echo/ vascular) testing. Due to room size and safety concerns, children are not allowed in the ultrasound rooms during exams. Our front office staff cannot provide observation of children in our lobby area while testing is being conducted. An adult accompanying a patient to their appointment will only be allowed in the  ultrasound room at the discretion of the ultrasound technician under special circumstances. We apologize for any inconvenience.    Follow-Up: At [facility], you and your health needs are our priority.  As part of our continuing mission to provide you with exceptional heart care, we have created designated Provider Care Teams.  These Care Teams include your primary Cardiologist (physician) and Advanced Practice Providers (APPs -  Physician Assistants and Nurse Practitioners) who all work together to provide you with the care you need, when you need it.  Your next appointment:   3 month(s)  The format for your next appointment:   In Person  Provider:   You will follow up in the Atrial Fibrillation Clinic located at [facility]. Your provider will be: Clint R. Fenton, PA-C or Lake Bells, PA-C    Thank you for choosing [facility]!!   Dory Horn, RN [phone]  Other Instructions    1st Floor: - Lobby - Registration  - Pharmacy  - Lab - Cafe  2nd Floor: - PV Lab - Diagnostic Testing (echo, CT, nuclear med)  3rd Floor: - Vacant  4th Floor: - TCTS (cardiothoracic surgery) - AFib Clinic - Structural Heart Clinic - Vascular Surgery  - Vascular Ultrasound  5th Floor: - HeartCare Cardiology (general and EP) - Clinical Pharmacy for coumadin, hypertension, lipid, weight-loss medications, and med management appointments    Valet parking services will be available as well.       Flecainide Tablets What is this medication? FLECAINIDE (FLEK a nide) prevents and treats a fast or irregular heartbeat (arrhythmia). It is often used to treat a type  of arrhythmia known as AFib (atrial fibrillation). It works by slowing down overactive electric signals in the heart, which stabilizes your heart rhythm. It belongs to a group of medications called antiarrhythmics. This medicine may be used for other purposes; ask your health care provider or pharmacist  if you have questions. COMMON BRAND NAME(S): Tambocor What should I tell my care team before I take this medication? They need to know if you have any of these conditions: High or low levels of potassium in the blood Heart disease including heart rhythm and heart rate problems Kidney disease Liver disease Recent heart attack An unusual or allergic reaction to flecainide, other medications, foods, dyes, or preservatives Pregnant or trying to get pregnant Breastfeeding How should I use this medication? Take this medication by mouth with a glass of water. Take it as directed on the prescription label at the same time every day. You can take it with or without food. If it upsets your stomach, take it with food. Do not take your medication more often than directed. Do not stop taking this medication suddenly. This may cause serious, heart-related side effects. If your care team wants you to stop the medication, the dose may be slowly lowered over time to avoid any side effects. Talk to your care team about the use of this medication in children. While it may be prescribed for children as young as 1 year for selected conditions, precautions do apply. Overdosage: If you think you have taken too much of this medicine contact a poison control center or emergency room at once. NOTE: This medicine is only for you. Do not share this medicine with others. What if I miss a dose? If you miss a dose, take it as soon as you can. If it is almost time for your next dose, take only that dose. Do not take double or extra doses. What may interact with this medication? Do not take this medication with any of the following: Amoxapine Arsenic trioxide Certain antibiotics, such as clarithromycin, erythromycin, gatifloxacin, gemifloxacin, levofloxacin, moxifloxacin, sparfloxacin, or troleandomycin Certain antidepressants, called tricyclic antidepressants such as amitriptyline, imipramine, or nortriptyline Certain  medications for irregular heartbeat, such as disopyramide, encainide, moricizine, procainamide, propafenone, and quinidine Cisapride Delavirdine Droperidol Haloperidol Hawthorn Imatinib Levomethadyl Maprotiline Medications for malaria, such as chloroquine and halofantrine Pentamidine Phenothiazines, such as chlorpromazine, mesoridazine, prochlorperazine, thioridazine Pimozide Quinine Ranolazine Ritonavir Sertindole This medication may also interact with the following: Cimetidine Dofetilide Medications for angina or blood pressure Medications for irregular heartbeat, such as amiodarone and digoxin Ziprasidone This list may not describe all possible interactions. Give your health care provider a list of all the medicines, herbs, non-prescription drugs, or dietary supplements you use. Also tell them if you smoke, drink alcohol, or use illegal drugs. Some items may interact with your medicine. What should I watch for while using this medication? Visit your care team for regular checks on your progress. Because your condition and the use of this medication carries some risk, it is a good idea to carry an identification card, necklace, or bracelet with details of your condition, medications, and care team. Check your blood pressure and pulse rate as directed. Know what your blood pressure and pulse rate should be and when tod contact your care team. Your care team may schedule regular blood tests and electrocardiograms to check your progress. This medication may affect your coordination, reaction time, or judgment. Do not drive or operate machinery until you know how this medication affects you. Sit up  or stand slowly to reduce the risk of dizzy or fainting spells. Drinking alcohol with this medication can increase the risk of these side effects. What side effects may I notice from receiving this medication? Side effects that you should report to your care team as soon as possible: Allergic  reactions--skin rash, itching, hives, swelling of the face, lips, tongue, or throat Heart failure--shortness of breath, swelling of the ankles, feet, or hands, sudden weight gain, unusual weakness or fatigue Heart rhythm changes--fast or irregular heartbeat, dizziness, feeling faint or lightheaded, chest pain, trouble breathing Liver injury--right upper belly pain, loss of appetite, nausea, light-colored stool, dark yellow or brown urine, yellowing skin or eyes, unusual weakness or fatigue Side effects that usually do not require medical attention (report to your care team if they continue or are bothersome): Blurry vision Constipation Dizziness Fatigue Headache Nausea Tremors or shaking This list may not describe all possible side effects. Call your doctor for medical advice about side effects. You may report side effects to FDA at 1-800-FDA-1088. Where should I keep my medication? Keep out of the reach of children and pets. Store at room temperature between 15 and 30 degrees C (59 and 86 degrees F). Protect from light. Keep container tightly closed. Throw away any unused medication after the expiration date. NOTE: This sheet is a summary. It may not cover all possible information. If you have questions about this medicine, talk to your doctor, pharmacist, or health care provider.  2024 Elsevier/Gold Standard (2022-01-12 00:00:00)    Cardiac Ablation Cardiac ablation is a procedure to destroy (ablate) heart tissue that is sending bad signals. These bad signals cause the heart to beat very fast or in a way that is not normal. Destroying some tissues can help make the heart rhythm normal. Tell your doctor about: Any allergies you have. All medicines you are taking. These include vitamins, herbs, eye drops, creams, and over-the-counter medicines. Any problems you or family members have had with anesthesia. Any bleeding problems you have. Any surgeries you have had. Any medical conditions  you have. Whether you are pregnant or may be pregnant. What are the risks? Your doctor will talk with you about risks. These may include: Infection. Bruising and bleeding. Stroke or blood clots. Damage to nearby areas of your body. Allergies to medicines or dyes. Needing a pacemaker if the heart gets damaged. A pacemaker helps the heart beat normally. The procedure not working. What happens before the procedure? Medicines Ask your doctor about changing or stopping: Your normal medicines. Vitamins, herbs, and supplements. Over-the-counter medicines. Do not take aspirin or ibuprofen unless you are told to. General instructions Follow instructions from your doctor about what you may eat and drink. If you will be going home right after the procedure, plan to have a responsible adult: Take you home from the hospital or clinic. You will not be allowed to drive. Care for you for the time you are told. Ask your doctor what steps will be taken to prevent the spread of germs. What happens during the procedure?  An IV tube will be put into one of your veins. You may be given: A sedative. This helps you relax. Anesthesia. This will: Numb certain areas of your body. The skin on your neck or groin will be numbed. A cut (incision) will be made in your neck or groin. A needle will be put through the cut and into a large vein. The small, thin tube (catheter) will be put into the needle. The  tube will be moved to your heart. A type of X-ray (fluoroscopy) will be used to help guide the tube. It will also show constant images of the heart on a screen. Dye may be put through the tube. This helps your doctor see your heart. An electric current will be sent from the tube to destroy heart tissue in certain areas. The tube will be taken out. Pressure will be held on your cut. This helps stop bleeding. A bandage (dressing) will be put over your cut. The procedure may vary among doctors and  hospitals. What happens after the procedure? You will be monitored until you leave the hospital or clinic. This includes checking your blood pressure, heart rate and rhythm, breathing rate, and blood oxygen level. Your cut will be checked for bleeding. You will need to lie still for a few hours. If your groin was used, you will need to keep your leg straight for a few hours after the small, thin tube is removed. This information is not intended to replace advice given to you by your health care provider. Make sure you discuss any questions you have with your health care provider. Document Revised: 11/23/2021 Document Reviewed: 11/23/2021 Elsevier Patient Education  2024 [employer].

## 2023-09-19 NOTE — Progress Notes (Signed)
 Electrophysiology Office Note:   Date:  09/19/2023  ID:  Ryan Schaefer, DOB 02-07-1981, MRN [id number]  Primary Cardiologist: None Primary Heart Failure: None Electrophysiologist: Niccole Witthuhn Jorja Loa, MD      History of Present Illness:   Ryan Schaefer is a 43 y.o. male with h/o Stratus, atrial fibrillation seen today for  for Electrophysiology evaluation of atrial fibrillation at the request of Leeroy Bock.    He has been having palpitations over the last few years.  Over this time, his episodes become longer and more frequent.  Prior to this, he was doing well.  He used to drink quite a bit of alcohol but has not had anything to drink in 3 years.  He uses vape pens, but does not smoke.  He wore a cardiac monitor that has shown episodes of atrial fibrillation.  When he is in atrial fibrillation, he feels weak, fatigued, short of breath.  Episodes last for up to 24 hours at a time.  Per his wife, he does snore and gasp for breath when he sleeps.  When he is in sinus rhythm, he has fatigue, but not nearly as bad as when he is having his arrhythmia.  He does not have shortness of breath when he is in normal rhythm.  Review of systems complete and found to be negative unless listed in HPI.   EP Information / Studies Reviewed:    EKG is ordered today. Personal review as below.  EKG Interpretation Date/Time:  Tuesday September 19 2023 14:49:52 EDT Ventricular Rate:  56 PR Interval:  158 QRS Duration:  78 QT Interval:  388 QTC Calculation: 374 R Axis:   92  Text Interpretation: Sinus bradycardia Rightward axis No previous ECGs available Confirmed by Asma Boldon ([id number]) on 09/19/2023 2:50:40 PM     Risk Assessment/Calculations:    CHA2DS2-VASc Score = 0   This indicates a 0.2% annual risk of stroke. The patient's score is based upon: CHF History: 0 HTN History: 0 Diabetes History: 0 Stroke History: 0 Vascular Disease History: 0 Age Score: 0 Gender Score: 0               Physical Exam:   VS:  BP 116/80 (BP Location: Left Arm, Patient Position: Sitting, Cuff Size: Normal)   Pulse (!) 56   Ht 5\' 9"  (1.753 m)   Wt 157 lb 6.4 oz (71.4 kg)   SpO2 96%   BMI 23.24 kg/m    Wt Readings from Last 3 Encounters:  09/19/23 157 lb 6.4 oz (71.4 kg)  09/22/21 175 lb 3.2 oz (79.5 kg)  12/10/16 148 lb (67.1 kg)     GEN: Well nourished, well developed in no acute distress NECK: No JVD; No carotid bruits CARDIAC: Regular rate and rhythm, no murmurs, rubs, gallops RESPIRATORY:  Clear to auscultation without rales, wheezing or rhonchi  ABDOMEN: Soft, non-tender, non-distended EXTREMITIES:  No edema; No deformity   ASSESSMENT AND PLAN:    1.  Paroxysmal atrial fibrillation: He wore a cardiac monitor that showed episodes of atrial fibrillation, personally reviewed.  He would likely benefit from rhythm control.  He has had a TSH which was within normal limits.  We Vraj Denardo plan for transthoracic echo.  I discussed rhythm control options including medication such as flecainide versus ablation.  It seems that he would prefer to avoid taking medications and would prefer ablation.  Elajah Kunsman discuss this further with his wife.  Risk, benefits, and alternatives to EP study and radiofrequency/pulse field ablation  for afib were also discussed in detail today. These risks include but are not limited to stroke, bleeding, vascular damage, tamponade, perforation, damage to the esophagus, lungs, and other structures, pulmonary vein stenosis, worsening renal function, and death.   2.  Snoring/daytime fatigue/gasping with sleep: Janautica Netzley plan for sleep study  Follow up with Afib Clinic in 3 months  Signed, Syon Tews Jorja Loa, MD

## 2023-10-10 ENCOUNTER — Encounter: Payer: Self-pay | Admitting: Cardiology

## 2023-10-13 ENCOUNTER — Telehealth: Payer: Self-pay

## 2023-10-13 NOTE — Telephone Encounter (Signed)
**Note De-Identified Rayon Mcchristian Obfuscation** Ordering provider: Dr Lawana Pray Associated diagnoses: Snoring-R06.83 and A-fib-I48.0  WatchPAT PA obtained on 10/13/2023 by Travonne Schowalter, Isabella Mao, LPN. Authorization: Per the [facility] Provider Portal: Prior Authorization/Notification is not required for the requested service(s): CPT Code: 95800-Itamar-HST. Decision ID #: [id number]  Patient notified of PIN (1234) on 10/13/2023 Jaella Weinert Notification Method: MyChart message.  Phone note routed to covering staff for follow-up.

## 2023-10-27 ENCOUNTER — Ambulatory Visit (HOSPITAL_COMMUNITY): Attending: Cardiology

## 2023-10-27 DIAGNOSIS — I480 Paroxysmal atrial fibrillation: Secondary | ICD-10-CM | POA: Insufficient documentation

## 2023-10-27 LAB — ECHOCARDIOGRAM COMPLETE
Area-P 1/2: 2.75 cm2
S' Lateral: 2.8 cm

## 2023-11-01 ENCOUNTER — Encounter: Payer: Self-pay | Admitting: Cardiology

## 2023-11-06 ENCOUNTER — Ambulatory Visit: Payer: Self-pay | Admitting: *Deleted

## 2023-12-29 ENCOUNTER — Ambulatory Visit (HOSPITAL_COMMUNITY)
Admission: RE | Admit: 2023-12-29 | Discharge: 2023-12-29 | Disposition: A | Discharge status: Home / Self Care | Source: Ambulatory Visit | Attending: Internal Medicine | Admitting: Internal Medicine

## 2023-12-29 VITALS — BP 106/70 | HR 54 | Ht 69.0 in | Wt 152.0 lb

## 2023-12-29 DIAGNOSIS — I4891 Unspecified atrial fibrillation: Secondary | ICD-10-CM

## 2023-12-29 DIAGNOSIS — I480 Paroxysmal atrial fibrillation: Secondary | ICD-10-CM

## 2023-12-29 NOTE — Progress Notes (Addendum)
 Primary Care Physician: Patient, No Pcp Per Primary Cardiologist: None Electrophysiologist: Will Gladis Norton, MD     Referring Physician: Dr. Norton Alm LITTIE Ryan Schaefer is a 43 y.o. male with a history of vaping, former alcohol use, and atrial fibrillation who presents for consultation in the [facility] Atrial Fibrillation [facility]. Patient has a CHADS2VASC score of zero.  On evaluation today, he is currently in NSR. Seen by Dr. Norton on 09/19/23 with patient seeming to lean towards ablation instead of AAD (flecainide) for PAF episodes. He had a recent episode of Afib last week and duration was approximately one day.  Today, he denies symptoms of chest pain, shortness of breath, orthopnea, PND, lower extremity edema, dizziness, presyncope, syncope, snoring, daytime somnolence, bleeding, or neurologic sequela. The patient is tolerating medications without difficulties and is otherwise without complaint today.   he has a BMI of Body mass index is 22.45 kg/m.SABRA Filed Weights   12/29/23 0902  Weight: 68.9 kg    No current outpatient medications on file.   No current facility-administered medications for this encounter.    Atrial Fibrillation Management history:  Previous antiarrhythmic drugs: none Previous cardioversions: none Previous ablations: none Anticoagulation history: none   ROS- All systems are reviewed and negative except as per the HPI above.  Physical Exam: BP 106/70   Pulse (!) 54   Ht 5' 9 (1.753 m)   Wt 68.9 kg   BMI 22.45 kg/m   GEN: Well nourished, well developed in no acute distress NECK: No JVD; No carotid bruits CARDIAC: Regular bradycardic rate and rhythm, no murmurs, rubs, gallops RESPIRATORY:  Clear to auscultation without rales, wheezing or rhonchi  ABDOMEN: Soft, non-tender, non-distended EXTREMITIES:  No edema; No deformity   EKG today demonstrates  Vent. rate 54 BPM PR interval 164 ms QRS duration 86 ms QT/QTcB 380/360  ms P-R-T axes 66 100 64 Sinus bradycardia Rightward axis  When compared with ECG of 19-Sep-2023 14:49, No significant change was found  Echo 10/27/23 demonstrated  1. Left ventricular ejection fraction, by estimation, is 60 to 65%. The  left ventricle has normal function. The left ventricle has no regional  wall motion abnormalities. Left ventricular diastolic parameters were  normal.   2. Right ventricular systolic function is normal. The right ventricular  size is normal.   3. The mitral valve is normal in structure. No evidence of mitral valve  regurgitation. No evidence of mitral stenosis.   4. The aortic valve is tricuspid. Aortic valve regurgitation is trivial.  No aortic stenosis is present.   5. The inferior vena cava is normal in size with greater than 50%  respiratory variability, suggesting right atrial pressure of 3 mmHg.    ASSESSMENT & PLAN CHA2DS2-VASc Score = 0  The patient's score is based upon: CHF History: 0 HTN History: 0 Diabetes History: 0 Stroke History: 0 Vascular Disease History: 0 Age Score: 0 Gender Score: 0       ASSESSMENT AND PLAN: Paroxysmal Atrial Fibrillation (ICD10:  I48.0) The patient's CHA2DS2-VASc score is 0, indicating a 0.2% annual risk of stroke.    He is currently in NSR. We discussed in detail PFA, the possible risks, what to expect on day of procedure, and recovery time. We talked about with his continuing episodes of PAF and young age it would be beneficial to proceed with some form of rhythm control. He is not on current medication and would be very reluctant to begin AAD therapy such  as flecainide. It would be difficult as well due to his baseline bradycardia to begin an AV nodal agent. He and spouse will discuss and contact office if wish to proceed with ablation. We also discussed reducing potential triggers for Afib including excess caffeine, alcohol, treating sleep apnea, and reducing nicotine/cannabis use. I spent a total of 30  minutes with this patient today face to face. We discussed multiple treatment options for atrial fibrillation including rhythm control in the form of AAD therapy and ablation. I explained the possible risks vs benefits of medication and procedure, and different possible methods of long term treatment. I discussed the potential adverse effects of pulse field ablation. I answered multiple questions from patient and spouse during this time and reviewed his ECG and history during the visit.        Follow up Afib clinic prn.   Terra Pac, PA-C  Afib Clinic [facility] [address] [place], [place] [address] [phone]

## 2024-01-24 ENCOUNTER — Telehealth: Payer: Self-pay

## 2024-01-24 DIAGNOSIS — I4819 Other persistent atrial fibrillation: Secondary | ICD-10-CM

## 2024-01-24 NOTE — Telephone Encounter (Signed)
 Called pt to schedule Afib Ablation with Dr. Inocencio.  He is scheduled on 10/10 at 10:00 am. No CT needed.  He will have labs done the week of 8/15-19 at [facility].   I will mail instruction letter to pt per his request.

## 2024-03-01 ENCOUNTER — Telehealth: Payer: Self-pay | Admitting: *Deleted

## 2024-03-01 ENCOUNTER — Other Ambulatory Visit (HOSPITAL_COMMUNITY): Payer: Self-pay

## 2024-03-01 ENCOUNTER — Telehealth (HOSPITAL_COMMUNITY): Payer: Self-pay

## 2024-03-01 MED ORDER — APIXABAN 5 MG PO TABS
5.0000 mg | ORAL_TABLET | Freq: Two times a day (BID) | ORAL | 0 refills | Status: DC
  Filled 2024-03-01: qty 60, 30d supply, fill #0

## 2024-03-01 MED ORDER — APIXABAN 5 MG PO TABS
5.0000 mg | ORAL_TABLET | Freq: Two times a day (BID) | ORAL | 6 refills | Status: DC
  Filled 2024-03-01 – 2024-04-08 (×2): qty 60, 30d supply, fill #0

## 2024-03-01 NOTE — Telephone Encounter (Signed)
 Spoke with patient to complete pre-procedure call.     Health status review:  Any new medical conditions, recent signs of acute illness or been started on antibiotics? No Any recent hospitalizations or surgeries? No Any new medications started since pre-op visit? Rx sent today for Eliquis  5mg  twice daily to pharmacy.  Follow all medication instructions prior to procedure or the procedure may be rescheduled:    Take Eliquis  (Apixaban ) twice daily without missing any doses before procedure. Essential chronic medications:  No medication should be continued, unless told otherwise. On the morning of your procedure DO NOT take any medication., including Eliquis  (Apixaban ).  Nothing to eat or drink after midnight prior to your procedure.  Pre-procedure testing scheduled: CT not needed lab work by September 19.  Confirmed patient is scheduled for Atrial Fibrillation Ablation on Friday, October 10 with Dr. Soyla Norton. Instructed patient to arrive at the Main Entrance A at [facility]: [address] [place], [place] [address] and check in at Admitting at 8:00 AM.  Advised of plan to go home the same day and will only stay overnight if medically necessary. You MUST have a responsible adult to drive you home and MUST be with you the first 24 hours after you arrive home or your procedure could be cancelled.  Informed patient a nurse will call a day before the procedure to confirm arrival time and ensure instructions are followed.  Patient verbalized understanding to information provided and is agreeable to proceed with procedure.   Advised patient to contact RN Navigator at [phone], to inform of any new medications started after call or concerns prior to procedure.

## 2024-03-01 NOTE — Telephone Encounter (Signed)
 Pt advised to start Eliquis  5 mg BID, start this weekend, in preparation for upcoming ablation scheduled for 03/29/2024.  He will stop by the pharmacy today. Patient verbalized understanding and agreeable to plan.

## 2024-03-04 ENCOUNTER — Other Ambulatory Visit (HOSPITAL_COMMUNITY): Payer: Self-pay

## 2024-03-08 LAB — CBC

## 2024-03-09 LAB — BASIC METABOLIC PANEL WITH GFR
BUN/Creatinine Ratio: 19 (ref 9–20)
BUN: 18 mg/dL (ref 6–24)
CO2: 22 mmol/L (ref 20–29)
Calcium: 9.6 mg/dL (ref 8.7–10.2)
Chloride: 104 mmol/L (ref 96–106)
Creatinine, Ser: 0.96 mg/dL (ref 0.76–1.27)
Glucose: 84 mg/dL (ref 70–99)
Potassium: 4.4 mmol/L (ref 3.5–5.2)
Sodium: 140 mmol/L (ref 134–144)
eGFR: 99 mL/min/1.73 (ref >59)

## 2024-03-09 LAB — CBC
Hematocrit: 41.6 % (ref 37.5–51.0)
Hemoglobin: 13.8 g/dL (ref 13.0–17.7)
MCH: 31.3 pg (ref 26.6–33.0)
MCHC: 33.2 g/dL (ref 31.5–35.7)
MCV: 94 fL (ref 79–97)
Platelets: 230 x10E3/uL (ref 150–450)
RBC: 4.41 x10E6/uL (ref 4.14–5.80)
RDW: 12.6 % (ref 11.6–15.4)
WBC: 5.8 x10E3/uL (ref 3.4–10.8)

## 2024-03-28 NOTE — Pre-Procedure Instructions (Signed)
 Attempted to call patient regarding procedure instructions for tomorrow.  Left voicemail on the following items: Arrival time 0800 Nothing to eat or drink after midnight No meds AM of procedure Responsible person to drive you home and stay with you for 24 hrs  Have you missed any doses of anti-coagulant Eliquis - should be taken twice a day, if you have missed any doses please let us  know.  Don't take dose morning of procedure.

## 2024-03-29 ENCOUNTER — Ambulatory Visit (HOSPITAL_COMMUNITY): Admitting: Anesthesiology

## 2024-03-29 ENCOUNTER — Encounter (HOSPITAL_COMMUNITY)
Admission: RE | Disposition: A | Discharge status: Home / Self Care | Payer: Self-pay | Source: Home / Self Care | Attending: Cardiology

## 2024-03-29 ENCOUNTER — Ambulatory Visit (HOSPITAL_COMMUNITY)
Admission: RE | Admit: 2024-03-29 | Discharge: 2024-03-29 | Disposition: A | Discharge status: Home / Self Care | Attending: Cardiology | Admitting: Cardiology

## 2024-03-29 ENCOUNTER — Other Ambulatory Visit: Payer: Self-pay

## 2024-03-29 DIAGNOSIS — Z87891 Personal history of nicotine dependence: Secondary | ICD-10-CM | POA: Diagnosis not present

## 2024-03-29 DIAGNOSIS — I4891 Unspecified atrial fibrillation: Secondary | ICD-10-CM | POA: Diagnosis not present

## 2024-03-29 DIAGNOSIS — I480 Paroxysmal atrial fibrillation: Secondary | ICD-10-CM

## 2024-03-29 HISTORY — PX: ATRIAL FIBRILLATION ABLATION: EP1191

## 2024-03-29 MED ORDER — DEXAMETHASONE SOD PHOSPHATE PF 10 MG/ML IJ SOLN
INTRAMUSCULAR | Status: DC | PRN
  Administered 2024-03-29: 10 mg via INTRAVENOUS

## 2024-03-29 MED ORDER — SUGAMMADEX SODIUM 200 MG/2ML IV SOLN
INTRAVENOUS | Status: DC | PRN
  Administered 2024-03-29: 280 mg via INTRAVENOUS

## 2024-03-29 MED ORDER — ROCURONIUM BROMIDE 100 MG/10ML IV SOLN
INTRAVENOUS | Status: DC | PRN
  Administered 2024-03-29: 70 mg via INTRAVENOUS

## 2024-03-29 MED ORDER — ATROPINE SULFATE 1 MG/10ML IJ SOSY
PREFILLED_SYRINGE | INTRAMUSCULAR | Status: DC | PRN
  Administered 2024-03-29: 1 mg via INTRAVENOUS

## 2024-03-29 MED ORDER — ACETAMINOPHEN 325 MG PO TABS: 650.0000 mg | ORAL_TABLET | ORAL | Status: DC | PRN

## 2024-03-29 MED ORDER — MEPERIDINE HCL 25 MG/ML IJ SOLN: 6.2500 mg | INTRAMUSCULAR | Status: DC | PRN

## 2024-03-29 MED ORDER — OXYCODONE HCL 5 MG PO TABS: 5.0000 mg | ORAL_TABLET | Freq: Once | ORAL | Status: DC | PRN

## 2024-03-29 MED ORDER — OXYCODONE HCL 5 MG/5ML PO SOLN: 5.0000 mg | Freq: Once | ORAL | Status: DC | PRN

## 2024-03-29 MED ORDER — SODIUM CHLORIDE 0.9 % IV SOLN: 250.0000 mL | INTRAVENOUS | Status: DC | PRN

## 2024-03-29 MED ORDER — PHENYLEPHRINE 80 MCG/ML (10ML) SYRINGE FOR IV PUSH (FOR BLOOD PRESSURE SUPPORT)
PREFILLED_SYRINGE | INTRAVENOUS | Status: DC | PRN
  Administered 2024-03-29: 80 ug via INTRAVENOUS

## 2024-03-29 MED ORDER — PROTAMINE SULFATE 10 MG/ML IV SOLN
INTRAVENOUS | Status: DC | PRN
  Administered 2024-03-29: 40 mg via INTRAVENOUS

## 2024-03-29 MED ORDER — FENTANYL CITRATE (PF) 100 MCG/2ML IJ SOLN
INTRAMUSCULAR | Status: DC | PRN
  Administered 2024-03-29 (×2): 50 ug via INTRAVENOUS

## 2024-03-29 MED ORDER — SODIUM CHLORIDE 0.9 % IV SOLN: 12.5000 mg | INTRAVENOUS | Status: DC | PRN

## 2024-03-29 MED ORDER — PHENYLEPHRINE HCL-NACL 20-0.9 MG/250ML-% IV SOLN
INTRAVENOUS | Status: DC | PRN
  Administered 2024-03-29: 25 ug/min via INTRAVENOUS

## 2024-03-29 MED ORDER — SODIUM CHLORIDE 0.9 % IV SOLN: INTRAVENOUS | Status: DC | PRN

## 2024-03-29 MED ORDER — LIDOCAINE 2% (20 MG/ML) 5 ML SYRINGE
INTRAMUSCULAR | Status: DC | PRN
  Administered 2024-03-29: 100 mg via INTRAVENOUS

## 2024-03-29 MED ORDER — PROPOFOL 10 MG/ML IV BOLUS
INTRAVENOUS | Status: DC | PRN
  Administered 2024-03-29: 130 mg via INTRAVENOUS

## 2024-03-29 MED ORDER — SODIUM CHLORIDE 0.9% FLUSH: 3.0000 mL | INTRAVENOUS | Status: DC | PRN

## 2024-03-29 MED ORDER — ONDANSETRON HCL 4 MG/2ML IJ SOLN: 4.0000 mg | Freq: Four times a day (QID) | INTRAMUSCULAR | Status: DC | PRN

## 2024-03-29 MED ORDER — FENTANYL CITRATE (PF) 100 MCG/2ML IJ SOLN
INTRAMUSCULAR | Status: AC
  Filled 2024-03-29: qty 2

## 2024-03-29 MED ORDER — AMISULPRIDE (ANTIEMETIC) 5 MG/2ML IV SOLN: 10.0000 mg | Freq: Once | INTRAVENOUS | Status: DC | PRN

## 2024-03-29 MED ORDER — DEXMEDETOMIDINE HCL IN NACL 80 MCG/20ML IV SOLN
INTRAVENOUS | Status: AC
  Filled 2024-03-29: qty 20

## 2024-03-29 MED ORDER — ONDANSETRON HCL 4 MG/2ML IJ SOLN
INTRAMUSCULAR | Status: DC | PRN
  Administered 2024-03-29: 4 mg via INTRAVENOUS

## 2024-03-29 MED ORDER — HYDROMORPHONE HCL 1 MG/ML IJ SOLN: 0.2500 mg | INTRAMUSCULAR | Status: DC | PRN

## 2024-03-29 MED ORDER — HEPARIN SODIUM (PORCINE) 1000 UNIT/ML IJ SOLN
INTRAMUSCULAR | Status: DC | PRN
  Administered 2024-03-29: 3000 [IU] via INTRAVENOUS
  Administered 2024-03-29: 14000 [IU] via INTRAVENOUS

## 2024-03-29 MED ORDER — SODIUM CHLORIDE 0.9 % IV SOLN: INTRAVENOUS | Status: DC

## 2024-03-29 MED ORDER — MIDAZOLAM HCL 2 MG/2ML IJ SOLN
INTRAMUSCULAR | Status: AC
  Filled 2024-03-29: qty 2

## 2024-03-29 MED ORDER — DEXMEDETOMIDINE HCL IN NACL 80 MCG/20ML IV SOLN
INTRAVENOUS | Status: DC | PRN
  Administered 2024-03-29 (×2): 10 ug via INTRAVENOUS
  Administered 2024-03-29 (×5): 4 ug via INTRAVENOUS

## 2024-03-29 MED ORDER — HEPARIN (PORCINE) IN NACL 1000-0.9 UT/500ML-% IV SOLN
INTRAVENOUS | Status: DC | PRN
  Administered 2024-03-29 (×3): 500 mL

## 2024-03-29 MED ORDER — MIDAZOLAM HCL 2 MG/2ML IJ SOLN
INTRAMUSCULAR | Status: DC | PRN
Start: 2024-03-29 — End: 2024-03-29
  Administered 2024-03-29: 2 mg via INTRAVENOUS

## 2024-03-29 NOTE — Anesthesia Preprocedure Evaluation (Signed)
 Anesthesia Evaluation  Patient identified by MRN, date of birth, ID band Patient awake    Reviewed: Allergy & Precautions, H&P , NPO status , Patient's Chart, lab work & pertinent test results  Airway Mallampati: II  TM Distance: >3 FB Neck ROM: Full    Dental  (+) Dental Advisory Given   Pulmonary neg pulmonary ROS, former smoker   Pulmonary exam normal breath sounds clear to auscultation       Cardiovascular Normal cardiovascular exam+ dysrhythmias Atrial Fibrillation  Rhythm:Regular Rate:Normal     Neuro/Psych negative neurological ROS  negative psych ROS   GI/Hepatic negative GI ROS, Neg liver ROS,,,  Endo/Other  negative endocrine ROS    Renal/GU negative Renal ROS  negative genitourinary   Musculoskeletal negative musculoskeletal ROS (+)    Abdominal   Peds negative pediatric ROS (+)  Hematology negative hematology ROS (+)   Anesthesia Other Findings   Reproductive/Obstetrics negative OB ROS                              Anesthesia Physical Anesthesia Plan  ASA: 3  Anesthesia Plan: General   Post-op Pain Management:    Induction: Intravenous  PONV Risk Score and Plan: 2 and Ondansetron, Midazolam and Treatment may vary due to age or medical condition  Airway Management Planned: Oral ETT  Additional Equipment:   Intra-op Plan:   Post-operative Plan: Extubation in OR  Informed Consent: I have reviewed the patients History and Physical, chart, labs and discussed the procedure including the risks, benefits and alternatives for the proposed anesthesia with the patient or authorized representative who has indicated his/her understanding and acceptance.     Dental advisory given  Plan Discussed with: CRNA  Anesthesia Plan Comments:         Anesthesia Quick Evaluation

## 2024-03-29 NOTE — Discharge Instructions (Signed)

## 2024-03-29 NOTE — Anesthesia Procedure Notes (Signed)
 Procedure Name: Intubation Date/Time: 03/29/2024 9:45 AM  Performed by: Obadiah Reyes BROCKS, CRNAPre-anesthesia Checklist: Patient identified, Emergency Drugs available, Suction available and Patient being monitored Patient Re-evaluated:Patient Re-evaluated prior to induction Oxygen Delivery Method: Circle System Utilized Preoxygenation: Pre-oxygenation with 100% oxygen Induction Type: IV induction Ventilation: Mask ventilation without difficulty Laryngoscope Size: Miller and 2 Grade View: Grade I Tube type: Oral Tube size: 7.5 mm Number of attempts: 1 Airway Equipment and Method: Stylet and Oral airway Placement Confirmation: ETT inserted through vocal cords under direct vision, positive ETCO2 and breath sounds checked- equal and bilateral Secured at: 23 cm Tube secured with: Tape Dental Injury: Teeth and Oropharynx as per pre-operative assessment

## 2024-03-29 NOTE — H&P (Signed)
    Date:  03/29/2024  ID:  Ryan Schaefer, DOB Jan 16, 1981, MRN [id number]  Primary Cardiologist: None Primary Heart Failure: None Electrophysiologist: Mertice Uffelman Gladis Norton, MD      History of Present Illness:   Ryan Schaefer is a 43 y.o. male with h/o Stratus, atrial fibrillation seen today for  for Electrophysiology evaluation of atrial fibrillation at the request of Alfonso Pilot.    Today, denies symptoms of palpitations, chest pain, dyspnea, orthopnea, PND, lower extremity edema, claudication, dizziness, presyncope, syncope, bleeding, or neurologic sequela. The patient is tolerating medications without difficulties. Plan ablation today.   EP Information / Studies Reviewed:    EKG is ordered today. Personal review as below.        Risk Assessment/Calculations:    CHA2DS2-VASc Score = 0   This indicates a 0.2% annual risk of stroke. The patient's score is based upon: CHF History: 0 HTN History: 0 Diabetes History: 0 Stroke History: 0 Vascular Disease History: 0 Age Score: 0 Gender Score: 0        STOP-Bang Score:  4       Physical Exam:   VS:  BP (!) 119/93   Pulse 90   Temp 98.2 F (36.8 C) (Oral)   Resp 16   Ht 5' 9 (1.753 m)   Wt 70.3 kg   SpO2 98%   BMI 22.89 kg/m    Wt Readings from Last 3 Encounters:  03/29/24 70.3 kg  12/29/23 68.9 kg  09/19/23 71.4 kg    GEN: Well nourished, well developed in no acute distress NECK: No JVD; No carotid bruits CARDIAC: Regular rate and rhythm, no murmurs, rubs, gallops RESPIRATORY:  Clear to auscultation without rales, wheezing or rhonchi  ABDOMEN: Soft, non-tender, non-distended EXTREMITIES:  No edema; No deformity  Lab Results     ** No results found for the last 12 hours. **       ASSESSMENT AND PLAN:    1.  Paroxysmal atrial fibrillation: Ryan Schaefer has presented today for surgery, with the diagnosis of AF.  The various methods of treatment have been discussed with the patient and family.  After consideration of risks, benefits and other options for treatment, the patient has consented to  Procedure(s): Catheter ablation as a surgical intervention .  Risks include but not limited to complete heart block, stroke, esophageal damage, nerve damage, bleeding, vascular damage, tamponade, perforation, MI, and death. The patient's history has been reviewed, patient examined, no change in status, stable for surgery.  I have reviewed the patient's chart and labs.  Questions were answered to the patient's satisfaction.    Donn Zanetti Norton, MD 03/29/2024 8:53 AM

## 2024-03-29 NOTE — Anesthesia Postprocedure Evaluation (Signed)
 Anesthesia Post Note  Patient: Ryan Schaefer  Procedure(s) Performed: ATRIAL FIBRILLATION ABLATION     Patient location during evaluation: PACU Anesthesia Type: General Level of consciousness: awake and alert Pain management: pain level controlled Vital Signs Assessment: post-procedure vital signs reviewed and stable Respiratory status: spontaneous breathing, nonlabored ventilation and respiratory function stable Cardiovascular status: blood pressure returned to baseline and stable Postop Assessment: no apparent nausea or vomiting Anesthetic complications: no   There were no known notable events for this encounter.  Last Vitals:  Vitals:   03/29/24 1210 03/29/24 1215  BP: 90/61 (!) 104/49  Pulse: 81 78  Resp: 14 (!) 31  Temp:    SpO2: 99% 95%    Last Pain:  Vitals:   03/29/24 1205  TempSrc:   PainSc: 0-No pain   Pain Goal:                   Butler Levander Pinal

## 2024-03-29 NOTE — Transfer of Care (Signed)
 Immediate Anesthesia Transfer of Care Note  Patient: Ryan Schaefer  Procedure(s) Performed: ATRIAL FIBRILLATION ABLATION  Patient Location: PACU  Anesthesia Type:General  Level of Consciousness: awake, alert , and oriented  Airway & Oxygen Therapy: Patient Spontanous Breathing and Patient connected to nasal cannula oxygen  Post-op Assessment: Report given to RN and Post -op Vital signs reviewed and stable  Post vital signs: Reviewed and stable  Last Vitals:  Vitals Value Taken Time  BP 115/75 03/29/24 11:09  Temp 36.6 C 03/29/24 11:09  Pulse 83 03/29/24 11:14  Resp 20 03/29/24 11:14  SpO2 97 % 03/29/24 11:14  Vitals shown include unfiled device data.  Last Pain:  Vitals:   03/29/24 1109  TempSrc: Oral  PainSc: 0-No pain         Complications: There were no known notable events for this encounter.

## 2024-03-31 ENCOUNTER — Encounter (HOSPITAL_COMMUNITY): Payer: Self-pay | Admitting: Cardiology

## 2024-04-01 ENCOUNTER — Telehealth (HOSPITAL_COMMUNITY): Payer: Self-pay

## 2024-04-01 LAB — POCT ACTIVATED CLOTTING TIME: Activated Clotting Time: 297 s

## 2024-04-01 NOTE — Telephone Encounter (Signed)
 Spoke with patient to complete post procedure follow up call.  Patient reports no complications with groin sites.   Instructions reviewed with patient:  It is normal to have bruising, tenderness, mild swelling, and a pea or marble sized lump/knot at the groin site which can take up to three months to resolve.  Get help right away if you notice sudden swelling at the puncture site.  Check your puncture site every day for signs of infection: fever, redness, swelling, pus drainage, warmth, foul odor or excessive pain. If this occurs, please call [phone], to speak with the RN Navigator. Get help right away if your puncture site is bleeding and the bleeding does not stop after applying firm pressure to the area.  You may continue to have skipped beats/ atrial fibrillation during the first several months after your procedure.  It is very important not to miss any doses of your blood thinner Eliquis .    You will follow up with the Afib clinic 4 weeks after your procedure and follow up with the Afib clinic 3 months after your procedure.   Patient verbalized understanding to all instructions provided.

## 2024-04-01 NOTE — Telephone Encounter (Signed)
 Attempted to reach patient to follow up with procedure completed on 03/29/24, no answer. Left VM for patient to return call.

## 2024-04-08 ENCOUNTER — Other Ambulatory Visit (HOSPITAL_COMMUNITY): Payer: Self-pay

## 2024-04-26 ENCOUNTER — Ambulatory Visit (HOSPITAL_COMMUNITY)
Admission: RE | Admit: 2024-04-26 | Discharge: 2024-04-26 | Disposition: A | Discharge status: Home / Self Care | Source: Ambulatory Visit | Attending: Internal Medicine | Admitting: Internal Medicine

## 2024-04-26 ENCOUNTER — Encounter (HOSPITAL_COMMUNITY): Payer: Self-pay | Admitting: Internal Medicine

## 2024-04-26 VITALS — BP 106/76 | HR 62 | Ht 69.0 in | Wt 158.2 lb

## 2024-04-26 DIAGNOSIS — I4819 Other persistent atrial fibrillation: Secondary | ICD-10-CM | POA: Diagnosis not present

## 2024-04-26 DIAGNOSIS — I480 Paroxysmal atrial fibrillation: Secondary | ICD-10-CM

## 2024-04-26 NOTE — Progress Notes (Signed)
 Primary Care Physician: Patient, No Pcp Per Primary Cardiologist: None Electrophysiologist: Ryan Gladis Norton, MD     Referring Physician: Dr. Norton Schaefer Ryan Schaefer is a 43 y.o. male with a history of vaping, former alcohol use, and atrial fibrillation who presents for consultation in the [facility]. Patient has a CHADS2VASC score of zero.  On follow up 04/26/24, patient is currently in NSR. S/p Afib ablation on 03/29/24 by Dr. Norton. No episodes of Afib since ablation. No chest pain or SOB. Leg sites healed without issue. No missed doses of anticoagulant.  Today, he denies symptoms of orthopnea, PND, lower extremity edema, dizziness, presyncope, syncope, snoring, daytime somnolence, bleeding, or neurologic sequela. The patient is tolerating medications without difficulties and is otherwise without complaint today.    he has a BMI of Body mass index is 23.36 kg/m.SABRA Filed Weights   04/26/24 1333  Weight: 71.8 kg     Current Outpatient Medications  Medication Sig Dispense Refill   apixaban  (ELIQUIS ) 5 MG TABS tablet Take 1 tablet (5 mg total) by mouth 2 (two) times daily. 60 tablet 6   naphazoline-glycerin (CLEAR EYES REDNESS) SOLN Place 1-2 drops into both eyes 4 (four) times daily as needed (Red eyes).     No current facility-administered medications for this encounter.    Atrial Fibrillation Management history:  Previous antiarrhythmic drugs: none Previous cardioversions: none Previous ablations: 03/29/24 Anticoagulation history: Eliquis    ROS- All systems are reviewed and negative except as per the HPI above.  Physical Exam: BP 106/76   Pulse 62   Ht 5' 9 (1.753 m)   Wt 71.8 kg   BMI 23.36 kg/m   GEN- The patient is well appearing, alert and oriented x 3 today.   Neck - no JVD or carotid bruit noted Lungs- Clear to ausculation bilaterally, normal work of breathing Heart- Regular rate and rhythm, no murmurs, rubs or  gallops, PMI not laterally displaced Extremities- no clubbing, cyanosis, or edema Skin - no rash or ecchymosis noted   EKG today demonstrates  Vent. rate 62 BPM PR interval 142 ms QRS duration 84 ms QT/QTcB 366/371 ms P-R-T axes 71 93 64 Normal sinus rhythm Rightward axis Borderline ECG When compared with ECG of 29-Mar-2024 11:22, No significant change was found  Echo 10/27/23 demonstrated  1. Left ventricular ejection fraction, by estimation, is 60 to 65%. The  left ventricle has normal function. The left ventricle has no regional  wall motion abnormalities. Left ventricular diastolic parameters were  normal.   2. Right ventricular systolic function is normal. The right ventricular  size is normal.   3. The mitral valve is normal in structure. No evidence of mitral valve  regurgitation. No evidence of mitral stenosis.   4. The aortic valve is tricuspid. Aortic valve regurgitation is trivial.  No aortic stenosis is present.   5. The inferior vena cava is normal in size with greater than 50%  respiratory variability, suggesting right atrial pressure of 3 mmHg.    ASSESSMENT & PLAN CHA2DS2-VASc Score = 0  The patient's score is based upon: CHF History: 0 HTN History: 0 Diabetes History: 0 Stroke History: 0 Vascular Disease History: 0 Age Score: 0 Gender Score: 0       ASSESSMENT AND PLAN: Paroxysmal Atrial Fibrillation (ICD10:  I48.0) The patient's CHA2DS2-VASc score is 0, indicating a 0.2% annual risk of stroke.   S/p Afib ablation on 03/29/24 by Dr. Norton.  Patient is  currently in NSR. Continue Eliquis  without interruption. Anticipate discontinuation of Eliquis  at upcoming appointment.     Follow up with EP as scheduled.    Terra Pac, PA-C  [facility] [address] [name], [place] [address] [phone]

## 2024-06-27 ENCOUNTER — Ambulatory Visit (HOSPITAL_COMMUNITY): Admitting: Internal Medicine

## 2024-06-28 ENCOUNTER — Ambulatory Visit (HOSPITAL_COMMUNITY)
Admission: RE | Admit: 2024-06-28 | Discharge: 2024-06-28 | Disposition: A | Source: Ambulatory Visit | Attending: Internal Medicine | Admitting: Internal Medicine

## 2024-06-28 ENCOUNTER — Encounter (HOSPITAL_COMMUNITY): Payer: Self-pay | Admitting: Internal Medicine

## 2024-06-28 VITALS — BP 124/76 | HR 75 | Ht 69.0 in | Wt 171.6 lb

## 2024-06-28 DIAGNOSIS — I480 Paroxysmal atrial fibrillation: Secondary | ICD-10-CM

## 2024-06-28 NOTE — Progress Notes (Signed)
 "   Primary Care Physician: Patient, No Pcp Per Primary Cardiologist: None Electrophysiologist: Will Gladis Norton, MD     Referring Physician: Dr. Norton Alm Ryan Schaefer is a 44 y.o. male with a history of vaping, former alcohol use, and atrial fibrillation who presents for consultation in the [facility]. Patient has a CHADS2VASC score of zero. S/p Afib ablation on 03/29/24 by Dr. Norton.   Follow-up 06/28/2024.  Patient is currently in NSR.  He has had overall no A-fib burden since last office visit.  He has not missed any doses of Eliquis .  Today, he denies symptoms of orthopnea, PND, lower extremity edema, dizziness, presyncope, syncope, snoring, daytime somnolence, bleeding, or neurologic sequela. The patient is tolerating medications without difficulties and is otherwise without complaint today.    he has a BMI of Body mass index is 25.34 kg/m.SABRA Filed Weights   06/28/24 1534  Weight: 77.8 kg      Current Outpatient Medications  Medication Sig Dispense Refill   naphazoline-glycerin (CLEAR EYES REDNESS) SOLN Place 1-2 drops into both eyes 4 (four) times daily as needed (Red eyes).     No current facility-administered medications for this encounter.    Atrial Fibrillation Management history:  Previous antiarrhythmic drugs: none Previous cardioversions: none Previous ablations: 03/29/24 Anticoagulation history: Eliquis    ROS- All systems are reviewed and negative except as per the HPI above.  Physical Exam: BP 124/76   Pulse 75   Ht 5' 9 (1.753 m)   Wt 77.8 kg   BMI 25.34 kg/m   GEN- The patient is well appearing, alert and oriented x 3 today.   Neck - no JVD or carotid bruit noted Lungs- Clear to ausculation bilaterally, normal work of breathing Heart- Regular rate and rhythm, no murmurs, rubs or gallops, PMI not laterally displaced Extremities- no clubbing, cyanosis, or edema Skin - no rash or ecchymosis noted    EKG  today demonstrates  EKG Interpretation Date/Time:  Friday June 28 2024 15:37:31 EST Ventricular Rate:  75 PR Interval:  138 QRS Duration:  82 QT Interval:  348 QTC Calculation: 388 R Axis:   91  Text Interpretation: Normal sinus rhythm Rightward axis Borderline ECG When compared with ECG of 26-Apr-2024 13:35, No significant change was found Confirmed by Terra Pac [phone]) on 06/28/2024 3:38:13 PM    Echo 10/27/23 demonstrated  1. Left ventricular ejection fraction, by estimation, is 60 to 65%. The  left ventricle has normal function. The left ventricle has no regional  wall motion abnormalities. Left ventricular diastolic parameters were  normal.   2. Right ventricular systolic function is normal. The right ventricular  size is normal.   3. The mitral valve is normal in structure. No evidence of mitral valve  regurgitation. No evidence of mitral stenosis.   4. The aortic valve is tricuspid. Aortic valve regurgitation is trivial.  No aortic stenosis is present.   5. The inferior vena cava is normal in size with greater than 50%  respiratory variability, suggesting right atrial pressure of 3 mmHg.    ASSESSMENT & PLAN CHA2DS2-VASc Score = 0  The patient's score is based upon: CHF History: 0 HTN History: 0 Diabetes History: 0 Stroke History: 0 Vascular Disease History: 0 Age Score: 0 Gender Score: 0       ASSESSMENT AND PLAN: Paroxysmal Atrial Fibrillation (ICD10:  I48.0) The patient's CHA2DS2-VASc score is 0, indicating a 0.2% annual risk of stroke.   S/p Afib ablation  on 03/29/24 by Dr. Inocencio.  Patient is currently in NSR.  He has done well since ablation.  Due to risk score of zero, will stop Eliquis  today.     Follow up with Dr. Inocencio in 6 months.  Terra Pac, PA-C  [facility] [address] [place], [place] [address] [phone]  "
